# Patient Record
Sex: Female | Born: 1995 | Race: White | Hispanic: No | Marital: Married | Smoking: Never smoker
Health system: Southern US, Community
[De-identification: ages and names within clinical notes are randomized; demographics above are authoritative.]

## PROBLEM LIST (undated history)

## (undated) DIAGNOSIS — T4145XA Adverse effect of unspecified anesthetic, initial encounter: Secondary | ICD-10-CM

## (undated) DIAGNOSIS — Z803 Family history of malignant neoplasm of breast: Secondary | ICD-10-CM

## (undated) DIAGNOSIS — R519 Headache, unspecified: Secondary | ICD-10-CM

## (undated) DIAGNOSIS — R51 Headache: Secondary | ICD-10-CM

## (undated) DIAGNOSIS — T79A21A Traumatic compartment syndrome of right lower extremity, initial encounter: Secondary | ICD-10-CM

## (undated) DIAGNOSIS — S060X9A Concussion with loss of consciousness of unspecified duration, initial encounter: Secondary | ICD-10-CM

## (undated) DIAGNOSIS — S060XAA Concussion with loss of consciousness status unknown, initial encounter: Secondary | ICD-10-CM

## (undated) DIAGNOSIS — R112 Nausea with vomiting, unspecified: Secondary | ICD-10-CM

## (undated) DIAGNOSIS — Z9889 Other specified postprocedural states: Secondary | ICD-10-CM

## (undated) DIAGNOSIS — T8859XA Other complications of anesthesia, initial encounter: Secondary | ICD-10-CM

## (undated) DIAGNOSIS — J45909 Unspecified asthma, uncomplicated: Secondary | ICD-10-CM

## (undated) HISTORY — DX: Family history of malignant neoplasm of breast: Z80.3

## (undated) HISTORY — PX: WISDOM TOOTH EXTRACTION: SHX21

---
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
## 2017-02-17 ENCOUNTER — Telehealth: Payer: Self-pay | Admitting: Sports Medicine

## 2017-02-17 NOTE — Telephone Encounter (Signed)
  [employer] Training Room Note Teresa Garrett. Teresa Garrett, [name]  Teresa Garrett - 21 y.o. female MRN [id number]  Date of birth: May 31, 1996  Visit Date: 02/17/17  PCP: Teresa Garrett, Teresa Garrett   Referred by: Teresa ref. provider found  SUBJECTIVE:  CC: Right medial shin pain HPI: Recurrence of right medial shin pain that she had similarly last year.  Prior MRI last year was negative for stress fracture.  She is significantly increased her mileage over the summer and has had a recurrence of pain.  She is taking last 2 weeks off and her symptoms are significantly approved.   she has tried running again with Teresa significant complaints. ROS:  Otherwise Garrett HPI.  Physical exam: Right leg is overall well aligned.  She has Teresa significant bruising swelling.  Teresa focal bony tenderness.  Posterior tibialis tendon is mildly tender to palpation.  Moderately high cavus arch.  Next  Assessment medial tibial stress syndrome Plan: We had previously fabricated custom orthotics for but these she reports as being uncomfortable.  I would like for the athletic training staff to look into this and see if we can get her into something that does provide support.  I am okay with beginning to resume the return to running protocol and will plan to follow-up with her only on an as-needed basis if any issues.  We discussed the importance of slowly increasing in her activity level.      Teresa Garrett Sports Medicine Physician    02/17/2017 6:26 PM

## 2017-07-23 DIAGNOSIS — N921 Excessive and frequent menstruation with irregular cycle: Secondary | ICD-10-CM | POA: Diagnosis not present

## 2017-08-22 DIAGNOSIS — M9902 Segmental and somatic dysfunction of thoracic region: Secondary | ICD-10-CM | POA: Diagnosis not present

## 2017-08-22 DIAGNOSIS — M62830 Muscle spasm of back: Secondary | ICD-10-CM | POA: Diagnosis not present

## 2017-08-22 DIAGNOSIS — M9905 Segmental and somatic dysfunction of pelvic region: Secondary | ICD-10-CM | POA: Diagnosis not present

## 2017-08-22 DIAGNOSIS — M9903 Segmental and somatic dysfunction of lumbar region: Secondary | ICD-10-CM | POA: Diagnosis not present

## 2017-08-26 DIAGNOSIS — M9902 Segmental and somatic dysfunction of thoracic region: Secondary | ICD-10-CM | POA: Diagnosis not present

## 2017-08-26 DIAGNOSIS — M62830 Muscle spasm of back: Secondary | ICD-10-CM | POA: Diagnosis not present

## 2017-08-26 DIAGNOSIS — M9903 Segmental and somatic dysfunction of lumbar region: Secondary | ICD-10-CM | POA: Diagnosis not present

## 2017-08-26 DIAGNOSIS — M9905 Segmental and somatic dysfunction of pelvic region: Secondary | ICD-10-CM | POA: Diagnosis not present

## 2017-09-29 DIAGNOSIS — M9905 Segmental and somatic dysfunction of pelvic region: Secondary | ICD-10-CM | POA: Diagnosis not present

## 2017-09-29 DIAGNOSIS — M9903 Segmental and somatic dysfunction of lumbar region: Secondary | ICD-10-CM | POA: Diagnosis not present

## 2017-09-29 DIAGNOSIS — M62830 Muscle spasm of back: Secondary | ICD-10-CM | POA: Diagnosis not present

## 2017-09-29 DIAGNOSIS — M9902 Segmental and somatic dysfunction of thoracic region: Secondary | ICD-10-CM | POA: Diagnosis not present

## 2018-04-28 ENCOUNTER — Ambulatory Visit (INDEPENDENT_AMBULATORY_CARE_PROVIDER_SITE_OTHER): Payer: BLUE CROSS/BLUE SHIELD | Admitting: Sports Medicine

## 2018-04-28 VITALS — BP 108/62 | Ht 67.0 in | Wt 130.0 lb

## 2018-04-28 DIAGNOSIS — M79661 Pain in right lower leg: Secondary | ICD-10-CM | POA: Diagnosis not present

## 2018-04-28 NOTE — Progress Notes (Signed)
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  She states that 3 weeks ago she was running her usual practice run, when she felt some cramping pain in her right medial calf.  She states this is about 30 minutes into her run.  She states she also has some associated numbness tingling of her middle 3 toes.  She stopped running and applied ice, massage, cupping over the area.  She had little relief from this.  She also took Advil at that time.  She states since that time she has had calf pain every time she does any kind of a long walk, or run.  She states she usually noticed about 10 minutes into the walk or run.  She states she has associated numbness in her 3 middle toes every time.  She states the pain is usually in the medial and lateral compartments of her lower calf.  She states after she stops running, she gets some relief.  She did see an orthopedic office 2 weeks ago.  The told her they believe this exertional compartment syndrome and put her in a calf compression sleeve.  She states this did not help with her symptoms.  She denies any weakness in the leg.  She denies any balance issues.  She denies any bowel or bladder dysfunction.  She has no prior injury to this area.  She does have lower back pain from time to time, which she goes to a chiropractor for.  Past Injuries: None Past Surgeries: None Smoking: Denies Family Hx: Noncontributory  ROS: Per HPI; in addition no fever, no rash, no additional weakness, no additional numbness, no additional paresthesias, and no additional falls/injury.  All past medical history, allergies, medications reviewed myself at today's visit.  Objective: BP 108/62   Ht 5\' 7"  (1.702 m)   Wt 130 lb (59 kg)   BMI 20.36 kg/m  Gen: Right-Hand Dominant. NAD, well groomed, a/o x3, normal affect.  CV: Well-perfused. Warm.  Resp: Non-labored.  Neuro: Sensation intact throughout. No gross  coordination deficits.  Gait: Nonpathologic posture, unremarkable stride without signs of limp or balance issues.  Back exam: No erythema, warmth, swelling noted.  No tenderness palpation on back exam.  Full range of motion forward flexion, extension of back.  Strength 5 out of 5 throughout lower extremity testing.  Negative straight leg raise, negative FADIR and FABER testing.  Calf exam: No erythema, warmth, swelling noted.  Mild tenderness palpation along the medial calf, mild tenderness palpation over the lateral calf.  Full range of motion in dorsiflexion, plantarflexion, eversion, inversion of the foot.  Strength 5 out of 5 throughout testing.  Negative Homans sign.  Brief neuro exam: Patellar and Achilles reflex 2+.  Assessment and Plan: Cramping calf pain with exertion, with some numbness of toes.  We discussed treatment options at today's visit.  She would like to pursue all options prior to undergoing compartment testing of her legs.  We will get an MRI of the lumbar spine as well as of the tib-fib at today's visit.  Lumbar MRI will rule out disc involvement leading to the cramping pain in her calf.  The tib-fib MRI will rule out stress fracture as a contributing cause.  If both these are normal, she should proceed with the Stryker testing for exertional compartment syndrome.  We will see her back following these test.  Lewanda Rife, MD [place] Fellow 04/28/2018  11:43 AM   Patient seen and evaluated with the sports medicine fellow.  I agree with the above plan of care.  Although patient's symptoms may suggest exercise-induced compartment syndrome, I think it is reasonable to rule out other sources of pain.  Therefore, we will order an MRI of the right tib-fib to rule out possible stress fracture and an MRI of her lumbar spine to rule out lumbar radiculopathy.  Phone follow-up with those findings when available.  If both studies are unremarkable then I would recommend  that she proceed with compartment testing.

## 2018-04-29 ENCOUNTER — Encounter: Payer: Self-pay | Admitting: Sports Medicine

## 2018-05-03 ENCOUNTER — Ambulatory Visit
Admission: RE | Admit: 2018-05-03 | Discharge: 2018-05-03 | Disposition: A | Discharge status: Home / Self Care | Payer: PRIVATE HEALTH INSURANCE | Source: Ambulatory Visit | Attending: Sports Medicine | Admitting: Sports Medicine

## 2018-05-03 DIAGNOSIS — M5126 Other intervertebral disc displacement, lumbar region: Secondary | ICD-10-CM | POA: Diagnosis not present

## 2018-05-03 DIAGNOSIS — M79661 Pain in right lower leg: Secondary | ICD-10-CM

## 2018-05-03 DIAGNOSIS — R600 Localized edema: Secondary | ICD-10-CM | POA: Diagnosis not present

## 2018-05-11 ENCOUNTER — Encounter: Payer: Self-pay | Admitting: Sports Medicine

## 2018-05-11 ENCOUNTER — Ambulatory Visit (INDEPENDENT_AMBULATORY_CARE_PROVIDER_SITE_OTHER): Payer: BLUE CROSS/BLUE SHIELD | Admitting: Sports Medicine

## 2018-05-11 VITALS — BP 103/62 | Ht 67.0 in | Wt 130.0 lb

## 2018-05-11 DIAGNOSIS — M79661 Pain in right lower leg: Secondary | ICD-10-CM

## 2018-05-11 NOTE — Progress Notes (Signed)
Patient ID: Teresa Garrett, female   DOB: [birth year], 22 y.o.   MRN: [id number]  Patient comes in today with her athletic trainer to discuss MRI findings of her lumbar spine as well as of her right tib-fib.  MRI of the lumbar spine shows some minimal disc bulging but no disc herniation.  MRI of the right lower leg show some periostitis along the proximal to mid tibia shaft posteriorly in the vicinity of the tibialis posterior. No stress fracture. Her physical exam today shows tenderness of both medial and lateral aspects of her lower leg.  Repeated toe raises reproduces lateral pain but she has no pain medially.  Although she has MRI changes in the right lower leg, I think these are likely incidental secondary to her running.  Her symptoms strongly suggest compartment syndrome.  I will refer her back over to Dr. Sharol Given to for consideration of compartment testing.  Patient and the athletic trainer are in agreement with this plan.

## 2018-06-29 ENCOUNTER — Ambulatory Visit (INDEPENDENT_AMBULATORY_CARE_PROVIDER_SITE_OTHER): Payer: BLUE CROSS/BLUE SHIELD | Admitting: Orthopedic Surgery

## 2018-06-29 ENCOUNTER — Encounter (INDEPENDENT_AMBULATORY_CARE_PROVIDER_SITE_OTHER): Payer: Self-pay | Admitting: Orthopedic Surgery

## 2018-06-29 VITALS — Ht 67.0 in | Wt 130.0 lb

## 2018-06-29 DIAGNOSIS — M79A21 Nontraumatic compartment syndrome of right lower extremity: Secondary | ICD-10-CM | POA: Diagnosis not present

## 2018-06-29 NOTE — Progress Notes (Signed)
   Office Visit Note   Patient: Teresa Garrett           Date of Birth: [birth year]           MRN: [id number] Visit Date: 06/29/2018              Requested by: No referring provider defined for this encounter. PCP: Patient, No Pcp Per  Chief Complaint  Patient presents with  . 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  Patient has been having exercised induced compartment symptoms in her anterior compartment of the right calf.  Patient states this is been going on for several months.  Patient has no pain at rest only has pain after running for a certain period of time.  Assessment & Plan: Visit Diagnoses:  1. 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  Patient states she would like to proceed with an anterior and lateral compartment release.  Risk and benefits were discussed including numbness on the dorsum of her foot.  Patient states she understands and wished to proceed as soon as possible.  Follow-Up Instructions: Return in about 2 weeks (around 07/13/2018).   Ortho Exam  Patient is alert, oriented, no adenopathy, well-dressed, normal affect, normal respiratory effort. Examination patient's right lower extremity is neurovascular intact she has good pulses she states occasionally after running she has some numbness over the dorsum of her foot she has no numbness at this time.  Her anterior compartment is soft there is no tenderness to palpation over the superficial peroneal nerve.  After informed consent the Stryker pressure monitor was used and her anterior compartment was measured at 20 mmHg.  Patient then went outside and ran became symptomatic and the compartment pressure was repeated at this time the compartment pressure was 58.  Patient has symptoms and signs consistent with exercised induced compartment  syndrome.  Imaging: No results found. No images are attached to the encounter.  Labs: No results found for: HGBA1C, ESRSEDRATE, CRP, LABURIC, REPTSTATUS, GRAMSTAIN, CULT, LABORGA   Lab Results  Component Value Date   ALBUMIN 4.1 05/06/2016    Body mass index is 20.36 kg/m.  Orders:  No orders of the defined types were placed in this encounter.  No orders of the defined types were placed in this encounter.    Procedures: No procedures performed  Clinical Data: No additional findings.  ROS:  All other systems negative, except as noted in the HPI. Review of Systems  Objective: Vital Signs: Ht 5\' 7"  (1.702 m)   Wt 130 lb (59 kg)   BMI 20.36 kg/m   Specialty Comments:  No specialty comments available.  PMFS History: Patient Active Problem List   Diagnosis Date Noted  . Medial tibial stress syndrome, left, initial encounter 04/30/2016   History reviewed. No pertinent past medical history.  History reviewed. No pertinent family history.  History reviewed. No pertinent surgical history. Social History   Occupational History  . Not on file  Tobacco Use  . Smoking status: Never Smoker  . Smokeless tobacco: Never Used  Substance and Sexual Activity  . Alcohol use: No  . Drug use: No  . Sexual activity: Yes    Birth control/protection: None

## 2018-07-01 ENCOUNTER — Other Ambulatory Visit: Payer: Self-pay

## 2018-07-01 ENCOUNTER — Encounter (HOSPITAL_COMMUNITY): Payer: Self-pay | Admitting: *Deleted

## 2018-07-01 NOTE — Progress Notes (Signed)
Ms Teresa Garrett denies chest pain, shortness of breath.  Patient takes an over the counter  dietary supplement, I instructed her to stop it at this time.

## 2018-07-02 ENCOUNTER — Ambulatory Visit (INDEPENDENT_AMBULATORY_CARE_PROVIDER_SITE_OTHER): Payer: Self-pay | Admitting: Physician Assistant

## 2018-07-03 ENCOUNTER — Encounter (HOSPITAL_COMMUNITY): Payer: Self-pay

## 2018-07-03 ENCOUNTER — Ambulatory Visit (HOSPITAL_COMMUNITY): Payer: BLUE CROSS/BLUE SHIELD | Admitting: Anesthesiology

## 2018-07-03 ENCOUNTER — Other Ambulatory Visit: Payer: Self-pay

## 2018-07-03 ENCOUNTER — Ambulatory Visit (HOSPITAL_COMMUNITY)
Admission: RE | Admit: 2018-07-03 | Discharge: 2018-07-03 | Disposition: A | Discharge status: Home / Self Care | Payer: BLUE CROSS/BLUE SHIELD | Attending: Orthopedic Surgery | Admitting: Orthopedic Surgery

## 2018-07-03 ENCOUNTER — Encounter (HOSPITAL_COMMUNITY)
Admission: RE | Disposition: A | Discharge status: Home / Self Care | Payer: Self-pay | Source: Home / Self Care | Attending: Orthopedic Surgery

## 2018-07-03 DIAGNOSIS — X58XXXA Exposure to other specified factors, initial encounter: Secondary | ICD-10-CM | POA: Diagnosis not present

## 2018-07-03 DIAGNOSIS — M79A21 Nontraumatic compartment syndrome of right lower extremity: Secondary | ICD-10-CM | POA: Diagnosis not present

## 2018-07-03 DIAGNOSIS — T79A21A Traumatic compartment syndrome of right lower extremity, initial encounter: Secondary | ICD-10-CM | POA: Diagnosis not present

## 2018-07-03 DIAGNOSIS — Y9302 Activity, running: Secondary | ICD-10-CM | POA: Insufficient documentation

## 2018-07-03 HISTORY — PX: FASCIOTOMY: SHX132

## 2018-07-03 HISTORY — DX: Traumatic compartment syndrome of right lower extremity, initial encounter: T79.A21A

## 2018-07-03 HISTORY — DX: Adverse effect of unspecified anesthetic, initial encounter: T41.45XA

## 2018-07-03 HISTORY — DX: Unspecified asthma, uncomplicated: J45.909

## 2018-07-03 HISTORY — DX: Concussion with loss of consciousness of unspecified duration, initial encounter: S06.0X9A

## 2018-07-03 HISTORY — DX: Headache: R51

## 2018-07-03 HISTORY — DX: Other specified postprocedural states: R11.2

## 2018-07-03 HISTORY — DX: Nausea with vomiting, unspecified: R11.2

## 2018-07-03 HISTORY — DX: Concussion with loss of consciousness status unknown, initial encounter: S06.0XAA

## 2018-07-03 HISTORY — DX: Other complications of anesthesia, initial encounter: T88.59XA

## 2018-07-03 HISTORY — DX: Headache, unspecified: R51.9

## 2018-07-03 HISTORY — DX: Other specified postprocedural states: Z98.890

## 2018-07-03 LAB — POCT PREGNANCY, URINE: Preg Test, Ur: NEGATIVE

## 2018-07-03 LAB — HEMOGLOBIN: Hemoglobin: 13.9 g/dL (ref 12.0–15.0)

## 2018-07-03 SURGERY — FASCIOTOMY, UPPER EXTREMITY
Anesthesia: General | Site: Leg Lower | Laterality: Right

## 2018-07-03 MED ORDER — ACETAMINOPHEN 500 MG PO TABS
1000.0000 mg | ORAL_TABLET | Freq: Once | ORAL | Status: AC
  Administered 2018-07-03: 1000 mg via ORAL

## 2018-07-03 MED ORDER — FENTANYL CITRATE (PF) 250 MCG/5ML IJ SOLN
INTRAMUSCULAR | Status: DC | PRN
  Administered 2018-07-03 (×2): 25 ug via INTRAVENOUS

## 2018-07-03 MED ORDER — MIDAZOLAM HCL 2 MG/2ML IJ SOLN
INTRAMUSCULAR | Status: AC
  Filled 2018-07-03: qty 2

## 2018-07-03 MED ORDER — CELECOXIB 200 MG PO CAPS
ORAL_CAPSULE | ORAL | Status: AC
  Administered 2018-07-03: 200 mg via ORAL
  Filled 2018-07-03: qty 1

## 2018-07-03 MED ORDER — CEFAZOLIN SODIUM-DEXTROSE 2-4 GM/100ML-% IV SOLN
2.0000 g | INTRAVENOUS | Status: AC
  Administered 2018-07-03: 2 g via INTRAVENOUS

## 2018-07-03 MED ORDER — ONDANSETRON HCL 4 MG/2ML IJ SOLN
INTRAMUSCULAR | Status: DC | PRN
  Administered 2018-07-03: 4 mg via INTRAVENOUS

## 2018-07-03 MED ORDER — MIDAZOLAM HCL 5 MG/5ML IJ SOLN
INTRAMUSCULAR | Status: DC | PRN
  Administered 2018-07-03: 2 mg via INTRAVENOUS

## 2018-07-03 MED ORDER — FAMOTIDINE 20 MG PO TABS
20.0000 mg | ORAL_TABLET | Freq: Once | ORAL | Status: AC
  Administered 2018-07-03: 20 mg via ORAL

## 2018-07-03 MED ORDER — HYDROMORPHONE HCL 1 MG/ML IJ SOLN
INTRAMUSCULAR | Status: AC
  Filled 2018-07-03: qty 1

## 2018-07-03 MED ORDER — DEXAMETHASONE SODIUM PHOSPHATE 10 MG/ML IJ SOLN
INTRAMUSCULAR | Status: DC | PRN
  Administered 2018-07-03: 10 mg via INTRAVENOUS

## 2018-07-03 MED ORDER — HYDROCODONE-ACETAMINOPHEN 5-325 MG PO TABS: 1.0000 | ORAL_TABLET | ORAL | 0 refills | Status: DC | PRN

## 2018-07-03 MED ORDER — LIDOCAINE 2% (20 MG/ML) 5 ML SYRINGE
INTRAMUSCULAR | Status: DC | PRN
  Administered 2018-07-03: 80 mg via INTRAVENOUS

## 2018-07-03 MED ORDER — LACTATED RINGERS IV SOLN
INTRAVENOUS | Status: DC
  Administered 2018-07-03: 11:00:00 via INTRAVENOUS

## 2018-07-03 MED ORDER — CEFAZOLIN SODIUM-DEXTROSE 2-4 GM/100ML-% IV SOLN
INTRAVENOUS | Status: AC
  Filled 2018-07-03: qty 100

## 2018-07-03 MED ORDER — OXYCODONE HCL 5 MG/5ML PO SOLN: 5.0000 mg | Freq: Once | ORAL | Status: DC | PRN

## 2018-07-03 MED ORDER — FAMOTIDINE 20 MG PO TABS
ORAL_TABLET | ORAL | Status: AC
  Administered 2018-07-03: 20 mg via ORAL
  Filled 2018-07-03: qty 1

## 2018-07-03 MED ORDER — DEXAMETHASONE SODIUM PHOSPHATE 10 MG/ML IJ SOLN
INTRAMUSCULAR | Status: AC
  Filled 2018-07-03: qty 1

## 2018-07-03 MED ORDER — HYDROCODONE-ACETAMINOPHEN 5-325 MG PO TABS
ORAL_TABLET | ORAL | Status: AC
  Filled 2018-07-03: qty 1

## 2018-07-03 MED ORDER — PROPOFOL 10 MG/ML IV BOLUS
INTRAVENOUS | Status: DC | PRN
  Administered 2018-07-03: 150 mg via INTRAVENOUS
  Administered 2018-07-03: 50 mg via INTRAVENOUS

## 2018-07-03 MED ORDER — HYDROCODONE-ACETAMINOPHEN 5-325 MG PO TABS
1.0000 | ORAL_TABLET | Freq: Once | ORAL | Status: AC
  Administered 2018-07-03: 1 via ORAL

## 2018-07-03 MED ORDER — CELECOXIB 200 MG PO CAPS
200.0000 mg | ORAL_CAPSULE | Freq: Once | ORAL | Status: AC
  Administered 2018-07-03: 200 mg via ORAL

## 2018-07-03 MED ORDER — PROPOFOL 10 MG/ML IV BOLUS
INTRAVENOUS | Status: AC
  Filled 2018-07-03: qty 20

## 2018-07-03 MED ORDER — CHLORHEXIDINE GLUCONATE 4 % EX LIQD: 60.0000 mL | Freq: Once | CUTANEOUS | Status: DC

## 2018-07-03 MED ORDER — HYDROMORPHONE HCL 1 MG/ML IJ SOLN
0.2500 mg | INTRAMUSCULAR | Status: DC | PRN
  Administered 2018-07-03: 0.25 mg via INTRAVENOUS

## 2018-07-03 MED ORDER — OXYCODONE HCL 5 MG PO TABS: 5.0000 mg | ORAL_TABLET | Freq: Once | ORAL | Status: DC | PRN

## 2018-07-03 MED ORDER — LACTATED RINGERS IV SOLN
INTRAVENOUS | Status: DC | PRN
  Administered 2018-07-03: 10:00:00 via INTRAVENOUS

## 2018-07-03 MED ORDER — PROMETHAZINE HCL 25 MG/ML IJ SOLN: 6.2500 mg | INTRAMUSCULAR | Status: DC | PRN

## 2018-07-03 MED ORDER — ONDANSETRON HCL 4 MG/2ML IJ SOLN
INTRAMUSCULAR | Status: AC
  Filled 2018-07-03: qty 2

## 2018-07-03 MED ORDER — FENTANYL CITRATE (PF) 250 MCG/5ML IJ SOLN
INTRAMUSCULAR | Status: AC
  Filled 2018-07-03: qty 5

## 2018-07-03 MED ORDER — ACETAMINOPHEN 500 MG PO TABS
ORAL_TABLET | ORAL | Status: AC
  Administered 2018-07-03: 1000 mg via ORAL
  Filled 2018-07-03: qty 2

## 2018-07-03 MED ORDER — 0.9 % SODIUM CHLORIDE (POUR BTL) OPTIME
TOPICAL | Status: DC | PRN
  Administered 2018-07-03: 1000 mL

## 2018-07-03 SURGICAL SUPPLY — 55 items
BANDAGE ACE 4X5 VEL STRL LF (GAUZE/BANDAGES/DRESSINGS) ×1 IMPLANT
BANDAGE ACE 6X5 VEL STRL LF (GAUZE/BANDAGES/DRESSINGS) ×1 IMPLANT
BNDG COHESIVE 4X5 TAN STRL (GAUZE/BANDAGES/DRESSINGS) ×2 IMPLANT
BNDG GAUZE ELAST 4 BULKY (GAUZE/BANDAGES/DRESSINGS) ×2 IMPLANT
CANISTER WOUND CARE 500ML ATS (WOUND CARE) IMPLANT
COVER SURGICAL LIGHT HANDLE (MISCELLANEOUS) ×2 IMPLANT
COVER WAND RF STERILE (DRAPES) ×1 IMPLANT
CUFF TOURNIQUET SINGLE 24IN (TOURNIQUET CUFF) IMPLANT
CUFF TOURNIQUET SINGLE 34IN LL (TOURNIQUET CUFF) IMPLANT
DRAPE INCISE IOBAN 66X45 STRL (DRAPES) ×1 IMPLANT
DRAPE ORTHO SPLIT 77X108 STRL (DRAPES)
DRAPE SURG ORHT 6 SPLT 77X108 (DRAPES) ×2 IMPLANT
DRAPE U-SHAPE 47X51 STRL (DRAPES) ×2 IMPLANT
DRSG ADAPTIC 3X8 NADH LF (GAUZE/BANDAGES/DRESSINGS) ×2 IMPLANT
DRSG PAD ABDOMINAL 8X10 ST (GAUZE/BANDAGES/DRESSINGS) ×2 IMPLANT
DRSG VAC ATS LRG SENSATRAC (GAUZE/BANDAGES/DRESSINGS) IMPLANT
DRSG VAC ATS MED SENSATRAC (GAUZE/BANDAGES/DRESSINGS) IMPLANT
DRSG VAC ATS SM SENSATRAC (GAUZE/BANDAGES/DRESSINGS) IMPLANT
DURAPREP 26ML APPLICATOR (WOUND CARE) ×2 IMPLANT
ELECT REM PT RETURN 9FT ADLT (ELECTROSURGICAL) ×2
ELECTRODE REM PT RTRN 9FT ADLT (ELECTROSURGICAL) ×1 IMPLANT
GAUZE SPONGE 4X4 12PLY STRL (GAUZE/BANDAGES/DRESSINGS) ×2 IMPLANT
GAUZE SPONGE 4X4 12PLY STRL LF (GAUZE/BANDAGES/DRESSINGS) ×1 IMPLANT
GLOVE BIOGEL PI IND STRL 7.5 (GLOVE) IMPLANT
GLOVE BIOGEL PI IND STRL 9 (GLOVE) ×1 IMPLANT
GLOVE BIOGEL PI INDICATOR 7.5 (GLOVE) ×1
GLOVE BIOGEL PI INDICATOR 9 (GLOVE) ×1
GLOVE SURG ORTHO 9.0 STRL STRW (GLOVE) ×2 IMPLANT
GLOVE SURG SS PI 6.5 STRL IVOR (GLOVE) ×1 IMPLANT
GLOVE SURG SS PI 7.0 STRL IVOR (GLOVE) ×2 IMPLANT
GLOVE SURG SS PI 7.5 STRL IVOR (GLOVE) ×2 IMPLANT
GOWN STRL REUS W/ TWL LRG LVL4 (GOWN DISPOSABLE) IMPLANT
GOWN STRL REUS W/ TWL XL LVL3 (GOWN DISPOSABLE) ×3 IMPLANT
GOWN STRL REUS W/TWL LRG LVL4 (GOWN DISPOSABLE) ×3
GOWN STRL REUS W/TWL XL LVL3 (GOWN DISPOSABLE) ×1
KIT BASIN OR (CUSTOM PROCEDURE TRAY) ×2 IMPLANT
KIT TURNOVER KIT B (KITS) ×2 IMPLANT
MANIFOLD NEPTUNE II (INSTRUMENTS) ×1 IMPLANT
MARKER SKIN DUAL TIP RULER LAB (MISCELLANEOUS) ×1 IMPLANT
NS IRRIG 1000ML POUR BTL (IV SOLUTION) ×2 IMPLANT
PACK GENERAL/GYN (CUSTOM PROCEDURE TRAY) ×2 IMPLANT
PAD ARMBOARD 7.5X6 YLW CONV (MISCELLANEOUS) ×5 IMPLANT
SET MONITOR QUICK PRESSURE (MISCELLANEOUS) IMPLANT
SPONGE LAP 18X18 X RAY DECT (DISPOSABLE) ×1 IMPLANT
STAPLER VISISTAT 35W (STAPLE) IMPLANT
STOCKINETTE IMPERVIOUS 9X36 MD (GAUZE/BANDAGES/DRESSINGS) ×1 IMPLANT
STRIP CLOSURE SKIN 1/2X4 (GAUZE/BANDAGES/DRESSINGS) ×1 IMPLANT
SUT ETHILON 2 0 FSLX (SUTURE) ×1 IMPLANT
SUT MNCRL AB 3-0 PS2 18 (SUTURE) ×1 IMPLANT
SUT VIC AB 0 CTB1 27 (SUTURE) IMPLANT
SUT VIC AB 2-0 CT2 27 (SUTURE) ×1 IMPLANT
SUT VIC AB 2-0 CTB1 (SUTURE) IMPLANT
TOWEL OR 17X24 6PK STRL BLUE (TOWEL DISPOSABLE) ×1 IMPLANT
TOWEL OR 17X26 10 PK STRL BLUE (TOWEL DISPOSABLE) ×2 IMPLANT
WATER STERILE IRR 1000ML POUR (IV SOLUTION) ×1 IMPLANT

## 2018-07-03 NOTE — Anesthesia Postprocedure Evaluation (Signed)
Anesthesia Post Note  Patient: Teresa Garrett  Procedure(s) Performed: ANTERIOR AND LATERAL COMPARTMENT RELEASE RIGHT LEG (Right Leg Lower)     Patient location during evaluation: PACU Anesthesia Type: General Level of consciousness: awake and alert Pain management: pain level controlled Vital Signs Assessment: post-procedure vital signs reviewed and stable Respiratory status: spontaneous breathing, nonlabored ventilation, respiratory function stable and patient connected to nasal cannula oxygen Cardiovascular status: blood pressure returned to baseline and stable Postop Assessment: no apparent nausea or vomiting Anesthetic complications: no    Last Vitals:  Vitals:   07/03/18 1415 07/03/18 1425  BP:  116/65  Pulse: (!) 56 65  Resp: 16 16  Temp:    SpO2: 100% 100%    Last Pain:  Vitals:   07/03/18 1425  TempSrc:   PainSc: 4                  Ryan P Ellender

## 2018-07-03 NOTE — Op Note (Signed)
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 female with a diagnosis of Exercise Induced Compatment Syndrome who failed conservative measures and elected for surgical management.    The risks benefits and alternatives were discussed with the patient preoperatively including but not limited to the risks of infection, bleeding, nerve injury, cardiopulmonary complications, the need for revision surgery, among others, and the patient was willing to proceed.  OPERATIVE IMPLANTS: None.  @ENCIMAGES @  OPERATIVE FINDINGS: Superficial peroneal nerve protected and intact and released.  OPERATIVE PROCEDURE: Patient was brought to the operating room and underwent a general anesthetic.  After adequate levels anesthesia were obtained patient's right lower extremity was prepped using DuraPrep draped into a sterile field a timeout was called.  A lateral incision was made 10 to 15 cm proximal to the lateral malleolus.  Blunt dissection was carried down to the superficial peroneal nerve the exiting foramen was released.  The anterior compartment was released proximal and distal with the nerve protected.  The lateral compartment was then released with the nerve protected.  Wound was irrigated with normal saline there was no bleeding.  Subcu was closed using 2-0 Vicryl subcu was closed using 3-0 Monocryl.  A sterile compressive dressing was applied patient was extubated taken the PACU in stable condition.   DISCHARGE PLANNING:  Antibiotic duration: Preoperative antibiotics only  Weightbearing: Weightbearing as tolerated  Pain medication: Prescription for Vicodin  Dressing care/ Wound VAC: Leave  dressing in place for 3 days then use the compression socks  Ambulatory devices: Crutches  Discharge to: Home  Follow-up: In the office 1 week post operative.

## 2018-07-03 NOTE — Progress Notes (Signed)
Orthopedic Tech Progress Note Patient Details:  Teresa Garrett [birth year] [id number]  Ortho Devices Type of Ortho Device: Crutches Ortho Device/Splint Interventions: Ordered, Application, Adjustment   Post Interventions Patient Tolerated: Well Instructions Provided: Adjustment of device, Care of device   Teresa Garrett J Raynee Mccasland 07/03/2018, 3:42 PM

## 2018-07-03 NOTE — H&P (Signed)
Franceska Strahm is an 23 y.o. female.   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  Patient has been having exercised induced compartment symptoms in her anterior compartment of the right calf.  Patient states this is been going on for several months.  Patient has no pain at rest only has pain after running for a certain period of time.The patient's compartment pressures increased from 20 mm Hg to 58 mm Hg after exercise. She presents for anterior and lateral compartment releases.   Past Medical History:  Diagnosis Date  . Asthma    as a child  . Concussion   . Headache    migraines-     Past Surgical History:  Procedure Laterality Date  . WISDOM TOOTH EXTRACTION     "gas"    History reviewed. No pertinent family history. Social History:  reports that she has never smoked. She has never used smokeless tobacco. She reports that she does not drink alcohol or use drugs.  Allergies:  Allergies  Allergen Reactions  . Apple Itching and Nausea Only    No medications prior to admission.    No results found for this or any previous visit (from the past 48 hour(s)). No results found.  Review of Systems  All other systems reviewed and are negative.   Last menstrual period 06/20/2018. Physical Exam  Constitutional: She appears well-developed and well-nourished. No distress.  HENT:  Head: Normocephalic and atraumatic.  Neck: Normal range of motion. Neck supple. No tracheal deviation present. No thyromegaly present.  Cardiovascular: Normal rate, regular rhythm, normal heart sounds and intact distal pulses.  Respiratory: Effort normal. No respiratory distress.  GI: Soft. She exhibits no distension.  Musculoskeletal: Normal range of motion.        General: Tenderness (right anterior/lateral compartments following exercise) present.  Neurological: She is alert. No cranial nerve deficit. She exhibits  normal muscle tone. Coordination normal.  Skin: Skin is warm and dry. No erythema.  Psychiatric: She has a normal mood and affect. Her behavior is normal. Thought content normal.     Assessment/Plan Exercised induced compartment syndrome right lower extremity- Plan anterior and lateral compartment releases.  This procedure has been fully reviewed with the patient and written informed consent has been obtained.   Erlinda Hong, PA-C 07/03/2018, [place] [phone]

## 2018-07-03 NOTE — Discharge Instructions (Signed)
General Anesthesia, Adult, Care After  This sheet gives you information about how to care for yourself after your procedure. Your health care provider may also give you more specific instructions. If you have problems or questions, contact your health care provider.  What can I expect after the procedure?  After the procedure, the following side effects are common:  Pain or discomfort at the IV site.  Nausea.  Vomiting.  Sore throat.  Trouble concentrating.  Feeling cold or chills.  Weak or tired.  Sleepiness and fatigue.  Soreness and body aches. These side effects can affect parts of the body that were not involved in surgery.  Follow these instructions at home:    For at least 24 hours after the procedure:  Have a responsible adult stay with you. It is important to have someone help care for you until you are awake and alert.  Rest as needed.  Do not:  Participate in activities in which you could fall or become injured.  Drive.  Use heavy machinery.  Drink alcohol.  Take sleeping pills or medicines that cause drowsiness.  Make important decisions or sign legal documents.  Take care of children on your own.  Eating and drinking  Follow any instructions from your health care provider about eating or drinking restrictions.  When you feel hungry, start by eating small amounts of foods that are soft and easy to digest (bland), such as toast. Gradually return to your regular diet.  Drink enough fluid to keep your urine pale yellow.  If you vomit, rehydrate by drinking water, juice, or clear broth.  General instructions  If you have sleep apnea, surgery and certain medicines can increase your risk for breathing problems. Follow instructions from your health care provider about wearing your sleep device:  Anytime you are sleeping, including during daytime naps.  While taking prescription pain medicines, sleeping medicines, or medicines that make you drowsy.  Return to your normal activities as told by your health care  provider. Ask your health care provider what activities are safe for you.  Take over-the-counter and prescription medicines only as told by your health care provider.  If you smoke, do not smoke without supervision.  Keep all follow-up visits as told by your health care provider. This is important.  Contact a health care provider if:  You have nausea or vomiting that does not get better with medicine.  You cannot eat or drink without vomiting.  You have pain that does not get better with medicine.  You are unable to pass urine.  You develop a skin rash.  You have a fever.  You have redness around your IV site that gets worse.  Get help right away if:  You have difficulty breathing.  You have chest pain.  You have blood in your urine or stool, or you vomit blood.  Summary  After the procedure, it is common to have a sore throat or nausea. It is also common to feel tired.  Have a responsible adult stay with you for the first 24 hours after general anesthesia. It is important to have someone help care for you until you are awake and alert.  When you feel hungry, start by eating small amounts of foods that are soft and easy to digest (bland), such as toast. Gradually return to your regular diet.  Drink enough fluid to keep your urine pale yellow.  Return to your normal activities as told by your health care provider. Ask your health care   provider what activities are safe for you.  This information is not intended to replace advice given to you by your health care provider. Make sure you discuss any questions you have with your health care provider.  Document Released: 09/16/2000 Document Revised: 01/24/2017 Document Reviewed: 01/24/2017  Elsevier Interactive Patient Education  2019 [employer]

## 2018-07-03 NOTE — Transfer of Care (Signed)
Immediate Anesthesia Transfer of Care Note  Patient: Teresa Garrett  Procedure(s) Performed: ANTERIOR AND LATERAL COMPARTMENT RELEASE RIGHT LEG (Right Leg Lower)  Patient Location: PACU  Anesthesia Type:General  Level of Consciousness: sedated, patient cooperative and responds to stimulation  Airway & Oxygen Therapy: Patient Spontanous Breathing and Patient connected to face mask oxygen  Post-op Assessment: Report given to RN and Post -op Vital signs reviewed and stable  Post vital signs: Reviewed and stable  Last Vitals:  Vitals Value Taken Time  BP 123/86 07/03/2018 12:30 PM  Temp 36.7 C 07/03/2018 12:30 PM  Pulse 53 07/03/2018 12:31 PM  Resp 14 07/03/2018 12:31 PM  SpO2 100 % 07/03/2018 12:31 PM  Vitals shown include unvalidated device data.  Last Pain:  Vitals:   07/03/18 1027  TempSrc:   PainSc: 0-No pain      Patients Stated Pain Goal: 4 (38/32/91 9166)  Complications: No apparent anesthesia complications

## 2018-07-03 NOTE — Anesthesia Procedure Notes (Signed)
Procedure Name: LMA Insertion Date/Time: 07/03/2018 11:44 AM Performed by: Jearld Pies, CRNA Pre-anesthesia Checklist: Patient identified, Emergency Drugs available, Suction available and Patient being monitored Patient Re-evaluated:Patient Re-evaluated prior to induction Oxygen Delivery Method: Circle System Utilized Preoxygenation: Pre-oxygenation with 100% oxygen Induction Type: IV induction Ventilation: Mask ventilation without difficulty LMA: LMA inserted LMA Size: 4.0 Number of attempts: 1 Airway Equipment and Method: Bite block Placement Confirmation: positive ETCO2 Tube secured with: Tape Dental Injury: Teeth and Oropharynx as per pre-operative assessment

## 2018-07-03 NOTE — Anesthesia Preprocedure Evaluation (Addendum)
Anesthesia Evaluation  Patient identified by MRN, date of birth, ID band Patient awake    Reviewed: Allergy & Precautions, NPO status , Patient's Chart, lab work & pertinent test results  History of Anesthesia Complications (+) PONV and history of anesthetic complications  Airway Mallampati: I  TM Distance: >3 FB Neck ROM: Full    Dental no notable dental hx.    Pulmonary neg pulmonary ROS,    Pulmonary exam normal breath sounds clear to auscultation       Cardiovascular negative cardio ROS Normal cardiovascular exam Rhythm:Regular Rate:Normal     Neuro/Psych  Headaches, negative psych ROS   GI/Hepatic negative GI ROS, Neg liver ROS,   Endo/Other  negative endocrine ROS  Renal/GU negative Renal ROS     Musculoskeletal negative musculoskeletal ROS (+)   Abdominal   Peds  Hematology negative hematology ROS (+)   Anesthesia Other Findings Exercise Induced Compatment Syndrome  Reproductive/Obstetrics hcg negative                            Anesthesia Physical Anesthesia Plan  ASA: I  Anesthesia Plan: General   Post-op Pain Management:    Induction: Intravenous  PONV Risk Score and Plan: 4 or greater and Midazolam, Dexamethasone, Ondansetron and Treatment may vary due to age or medical condition  Airway Management Planned: LMA  Additional Equipment:   Intra-op Plan:   Post-operative Plan: Extubation in OR  Informed Consent: I have reviewed the patients History and Physical, chart, labs and discussed the procedure including the risks, benefits and alternatives for the proposed anesthesia with the patient or authorized representative who has indicated his/her understanding and acceptance.   Dental advisory given  Plan Discussed with: CRNA  Anesthesia Plan Comments:         Anesthesia Quick Evaluation

## 2018-07-04 ENCOUNTER — Encounter (HOSPITAL_COMMUNITY): Payer: Self-pay | Admitting: Orthopedic Surgery

## 2018-07-16 ENCOUNTER — Ambulatory Visit (INDEPENDENT_AMBULATORY_CARE_PROVIDER_SITE_OTHER): Payer: BLUE CROSS/BLUE SHIELD | Admitting: Orthopedic Surgery

## 2018-07-16 ENCOUNTER — Encounter (INDEPENDENT_AMBULATORY_CARE_PROVIDER_SITE_OTHER): Payer: Self-pay | Admitting: Orthopedic Surgery

## 2018-07-16 VITALS — Ht 67.0 in | Wt 122.0 lb

## 2018-07-16 DIAGNOSIS — M79A21 Nontraumatic compartment syndrome of right lower extremity: Secondary | ICD-10-CM

## 2018-07-16 DIAGNOSIS — Z4889 Encounter for other specified surgical aftercare: Secondary | ICD-10-CM

## 2018-07-27 ENCOUNTER — Encounter (INDEPENDENT_AMBULATORY_CARE_PROVIDER_SITE_OTHER): Payer: Self-pay | Admitting: Orthopedic Surgery

## 2018-07-27 NOTE — Progress Notes (Signed)
   Office Visit Note   Patient: Teresa Garrett           Date of Birth: Oct 26, 1995           MRN: [id number] Visit Date: 07/16/2018              Requested by: No referring provider defined for this encounter. PCP: Patient, No Pcp Per  Chief Complaint  Patient presents with  . 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  She states she feels well feels a little stiff.  Assessment & Plan: Visit Diagnoses:  1. Exertional compartment syndrome of right lower extremity     Plan: The incision is well-healed we will have her trainer advance her activities as tolerated.  Recommended scar massage.  Follow-Up Instructions: Return if symptoms worsen or fail to improve.   Ortho Exam  Patient is alert, oriented, no adenopathy, well-dressed, normal affect, normal respiratory effort. Examination patient's right lower extremity is neurovascular intact the incision is well-healed there is no drainage no cellulitis no keloiding of the scar she has good range of motion of her ankle.  Imaging: No results found. No images are attached to the encounter.  Labs: No results found for: HGBA1C, ESRSEDRATE, CRP, LABURIC, REPTSTATUS, GRAMSTAIN, CULT, LABORGA   Lab Results  Component Value Date   ALBUMIN 4.1 05/06/2016    Body mass index is 19.11 kg/m.  Orders:  No orders of the defined types were placed in this encounter.  No orders of the defined types were placed in this encounter.    Procedures: No procedures performed  Clinical Data: No additional findings.  ROS:  All other systems negative, except as noted in the HPI. Review of Systems  Objective: Vital Signs: Ht 5\' 7"  (1.702 m)   Wt 122 lb (55.3 kg)   LMP 06/20/2018 (Exact Date)   BMI  19.11 kg/m   Specialty Comments:  No specialty comments available.  PMFS History: Patient Active Problem List   Diagnosis Date Noted  . Compartment syndrome of lower extremity due to exertion, right   . Medial tibial stress syndrome, left, initial encounter 04/30/2016   Past Medical History:  Diagnosis Date  . Asthma    as a child  . Compartment syndrome of right lower extremity ([place])   . Complication of anesthesia   . Concussion   . Headache    migraines-   . PONV (postoperative nausea and vomiting)     History reviewed. No pertinent family history.  Past Surgical History:  Procedure Laterality Date  . FASCIOTOMY Right 07/03/2018   Procedure: ANTERIOR AND LATERAL COMPARTMENT RELEASE RIGHT LEG;  Surgeon: Newt Minion, MD;  Location: [facility];  Service: Orthopedics;  Laterality: Right;  . WISDOM TOOTH EXTRACTION     "gas"   Social History   Occupational History  . Not on file  Tobacco Use  . Smoking status: Never Smoker  . Smokeless tobacco: Never Used  Substance and Sexual Activity  . Alcohol use: No  . Drug use: No  . Sexual activity: Yes    Birth control/protection: None

## 2018-09-08 DIAGNOSIS — H66002 Acute suppurative otitis media without spontaneous rupture of ear drum, left ear: Secondary | ICD-10-CM | POA: Diagnosis not present

## 2018-09-08 DIAGNOSIS — J029 Acute pharyngitis, unspecified: Secondary | ICD-10-CM | POA: Diagnosis not present

## 2018-11-11 ENCOUNTER — Encounter: Payer: Self-pay | Admitting: *Deleted

## 2018-11-12 ENCOUNTER — Ambulatory Visit (INDEPENDENT_AMBULATORY_CARE_PROVIDER_SITE_OTHER): Payer: BC Managed Care – PPO | Admitting: Internal Medicine

## 2018-11-12 ENCOUNTER — Encounter: Payer: Self-pay | Admitting: Internal Medicine

## 2018-11-12 ENCOUNTER — Other Ambulatory Visit: Payer: Self-pay

## 2018-11-12 VITALS — Ht 67.0 in | Wt 130.0 lb

## 2018-11-12 DIAGNOSIS — K5909 Other constipation: Secondary | ICD-10-CM

## 2018-11-12 MED ORDER — LINACLOTIDE 72 MCG PO CAPS: 72.0000 ug | ORAL_CAPSULE | Freq: Every day | ORAL | 1 refills | Status: DC

## 2018-11-12 NOTE — Patient Instructions (Addendum)
Your provider has requested that you go to the basement level for lab work. Press "B" on the elevator. The lab is located at the first door on the left as you exit the elevator.  Continue fiber gummy supplementation and liberal fluid intake.  We have sent the following medications to your pharmacy for you to pick up at your convenience: Linzess 72 mcg daily, 30 minutes before breakfast.  Please try this for 7 to 10 days and let me know if you have a side effect such as diarrhea or if it is ineffective where we would need to dose titrate.  Please follow up with Dr Hilarie Fredrickson in 2 months in the office.

## 2018-11-12 NOTE — Progress Notes (Signed)
Patient ID: Teresa Garrett, female   DOB: 1995-12-16, 23 y.o.   MRN: [id number]  This service was provided via telemedicine.  Doximity App with AV communication The patient was located at home The provider was located in provider's GI office. The patient did consent to this telephone visit and is aware of possible charges through their insurance for this visit.   The persons participating in this telemedicine service were the patient and I. Time spent on call: 19 min  HPI: Teresa Garrett is a 23 year old female with little past medical history other than lower extremity compartment syndrome status post surgery to relieve compartment syndrome in January 2020 who is seen to evaluate constipation.  She is seen by Doximity app with A/V communication rather than in person in the setting of COVID-19 pandemic.  She reports that around October 2019 she developed constipation.  This does correspond to the time that she got sick with her lower extremity compartment syndrome.  She eventually underwent surgery in January of this year.  Prior to October her usual bowel habit was 1 or 2 bowel movements per day but over the last 6 to 8 months she has developed constipation.  She reports that her stools are less frequent, harder and more painful to pass.  Her frequency was a bowel movement every 4 to 5 days.  This was associated with lower abdominal discomfort, bloating and also discomfort under her rib cage.  She started using a women's laxative 1 a day at first increasing to twice a day and with this was still having a bowel movement only every 3 to 4 days.  Often with laxatives she would have hard stool followed by loose stool and even diarrhea.  In January when she had her surgery she used a pain medication and they recommended a stool softener which she took for about a month.  It helped a little bit but after stopping both the pain medication and stool softener her bowel habits returned to more constipated  state.  Her weight is up about 12 pounds in the last 6 months or so having normally been stable around 130 pound.  Her periods are regular but this is not new for her with her last menstrual cycle being 10/15/2018.  She denies seeing blood in her stool and melena.  No upper GI or hepatobiliary complaint.  No GI family history of note.  She enjoys running but has not been back to full running since her surgery.  She is working part-time for [employer].  Past Medical History:  Diagnosis Date  . Asthma    as a child  . Compartment syndrome of right lower extremity ([place])   . Complication of anesthesia   . Concussion   . Headache    migraines-   . PONV (postoperative nausea and vomiting)     Past Surgical History:  Procedure Laterality Date  . FASCIOTOMY Right 07/03/2018   Procedure: ANTERIOR AND LATERAL COMPARTMENT RELEASE RIGHT LEG;  Surgeon: Newt Minion, MD;  Location: [facility];  Service: Orthopedics;  Laterality: Right;  . WISDOM TOOTH EXTRACTION     "gas"    No outpatient medications prior to visit.   No facility-administered medications prior to visit.     Allergies  Allergen Reactions  . Apple Itching and Nausea Only    Family History  Problem Relation Age of Onset  . Colon cancer Neg Hx   . Esophageal cancer Neg Hx   . Pancreatic cancer Neg Hx   .  Stomach cancer Neg Hx   . Liver disease Neg Hx     Social History   Tobacco Use  . Smoking status: Never Smoker  . Smokeless tobacco: Never Used  Substance Use Topics  . Alcohol use: No  . Drug use: No    ROS: As per history of present illness, otherwise negative  Ht 5\' 7"  (1.702 m)   Wt 130 lb (59 kg)   BMI 20.36 kg/m  No PE, virtual visit  RELEVANT LABS AND IMAGING: CBC    Component Value Date/Time   WBC 12.7 (H) 05/06/2016 1707   RBC 4.50 05/06/2016 1707   HGB 13.9 07/03/2018 0952   HCT 39.5 05/06/2016 1707   PLT 222 05/06/2016 1707   MCV 87.8 05/06/2016 1707   MCH 28.9 05/06/2016 1707   MCHC 32.9  05/06/2016 1707   RDW 13.7 05/06/2016 1707    ASSESSMENT/PLAN: 23 year old female with little past medical history other than lower extremity compartment syndrome status post surgery to relieve compartment syndrome in January 2020 who is seen to evaluate constipation.  1. Constipation --constipation over the last 7 or 8 months, thus chronic.  No alarm symptoms.  Query whether with her activity change her bowels have followed suit.  I would like to exclude hypothyroidism and also celiac disease, the latter can present rarely with constipation.  She is already eating fiber Gummies per day without benefit.  I recommended the following --TSH and celiac panel --Continue fiber gummy supplementation and liberal fluid intake --Begin Linzess 72 mcg daily, 30 minutes before breakfast.  I asked that she try this for 7 to 10 days and let me know if she has side effect such as diarrhea or if it is ineffective where we would dose titrate --Follow-up in about 2 months for continuity and to see how she is doing

## 2018-11-12 NOTE — Addendum Note (Signed)
Addended by: Larina Bras on: 11/12/2018 04:00 PM   Modules accepted: Orders

## 2018-11-18 ENCOUNTER — Other Ambulatory Visit (INDEPENDENT_AMBULATORY_CARE_PROVIDER_SITE_OTHER): Payer: BLUE CROSS/BLUE SHIELD

## 2018-11-18 DIAGNOSIS — K5909 Other constipation: Secondary | ICD-10-CM

## 2018-11-18 LAB — TSH: TSH: 3.02 u[IU]/mL (ref 0.35–4.50)

## 2018-11-18 LAB — IGA: IgA: 116 mg/dL (ref 68–378)

## 2018-11-19 LAB — TISSUE TRANSGLUTAMINASE, IGA: (tTG) Ab, IgA: 1 U/mL

## 2018-11-25 NOTE — Telephone Encounter (Signed)
Tell the patient I received her email We could try the next higher dose of Linzess which would be 145 mcg daily to see if she has more complete evacuation I would like to see her in the office, next available, for follow-up this will allow for appropriate physical examination Linzess has been proven to help with abdominal pain but the improvement in abdominal pain tends to lag behind the constipation response.  That said if abdominal pain continues we will work-up further

## 2018-11-26 ENCOUNTER — Other Ambulatory Visit: Payer: Self-pay

## 2018-11-26 MED ORDER — LINACLOTIDE 145 MCG PO CAPS: 145.0000 ug | ORAL_CAPSULE | Freq: Every day | ORAL | 3 refills | Status: DC

## 2018-12-28 ENCOUNTER — Telehealth: Payer: Self-pay | Admitting: Internal Medicine

## 2018-12-28 ENCOUNTER — Encounter: Payer: Self-pay | Admitting: *Deleted

## 2018-12-28 NOTE — Progress Notes (Signed)
Went over prescreening information. Patient later explained that she thought she was coming to the office for appointment. She would prefer this. Changed patient to in office visit.

## 2018-12-30 ENCOUNTER — Other Ambulatory Visit: Payer: Self-pay

## 2018-12-30 ENCOUNTER — Encounter: Payer: Self-pay | Admitting: Internal Medicine

## 2018-12-30 ENCOUNTER — Ambulatory Visit (INDEPENDENT_AMBULATORY_CARE_PROVIDER_SITE_OTHER): Payer: BC Managed Care – PPO | Admitting: Internal Medicine

## 2018-12-30 VITALS — BP 98/62 | HR 68 | Temp 98.1°F | Ht 67.0 in | Wt 134.4 lb

## 2018-12-30 DIAGNOSIS — R1030 Lower abdominal pain, unspecified: Secondary | ICD-10-CM | POA: Diagnosis not present

## 2018-12-30 DIAGNOSIS — K5900 Constipation, unspecified: Secondary | ICD-10-CM | POA: Diagnosis not present

## 2018-12-30 MED ORDER — LINACLOTIDE 290 MCG PO CAPS: 290.0000 ug | ORAL_CAPSULE | Freq: Every day | ORAL | 2 refills | Status: DC

## 2018-12-30 MED ORDER — SUPREP BOWEL PREP KIT 17.5-3.13-1.6 GM/177ML PO SOLN: 1.0000 | ORAL | 0 refills | Status: DC

## 2018-12-30 NOTE — Progress Notes (Signed)
   Subjective:    Patient ID: Teresa Garrett, female    DOB: [birth year], 23 y.o.   MRN: [id number]  HPI Teresa Garrett is a 23 year old female with a history of constipation over the last 9 months, history of lower extremity compartment syndrome who is seen for follow-up.  She was seen by virtual visit on 11/12/2018.  She is seen in person today and she is here alone.  At the time of her last visit we discussed constipation and lower abdominal pain.  We checked a TSH which was normal and a celiac panel which was negative.  She has continued fiber supplementation and we also started her on Linzess initially 72 mcg daily.  This was increased to 145 mcg daily.  She is still struggling with constipation lower abdominal pain.  Bowel movements are inconsistent.  She can still go 3 to 4 days without having a bowel movement and when she goes that long she can get pain in her upper abdomen as well.  This is a pressure type discomfort.  Other times stools can be hard and difficult to pass and then on some days she will have more of diarrhea as a result of the Linzess.  She feels that she has a normal bowel movement less than 1 day a week.  She is noticed some belching and nausea particular if she is very active such as rolling around and playing with her dog after eating.  No dysphagia or odynophagia.  She continues to be very active with running 30 to 45 minutes a day often 4 to 6 miles per day.  Last menstrual cycle was on 12/16/2018 though chronically her menstrual cycles are regular.  She has not seen blood in her stool.   Review of Systems As per HPI, otherwise negative  Current Medications, Allergies, Past Medical History, Past Surgical History, Family History and Social History were reviewed in [employer] record.     Objective:   Physical Exam BP 98/62 (BP Location: Left Arm, Patient Position: Sitting, Cuff Size: Normal)   Pulse 68   Temp 98.1 F (36.7 C) (Other (Comment))  Comment (Src): infared thermascan  Ht 5\' 7"  (1.702 m)   Wt 134 lb 6 oz (61 kg)   BMI 21.05 kg/m   Gen: awake, alert, NAD HEENT: anicteric, op clear CV: RRR, no mrg Pulm: CTA b/l Abd: soft, lower abdominal pain with moderate palpation without rebound or guarding, nondistended, +BS throughout Ext: no c/c/e Neuro: nonfocal      Assessment & Plan:  23 year old female with a history of constipation over the last 9 months, history of lower extremity compartment syndrome who is seen for follow-up.  1.  Constipation with lower abdominal pain --she has not responded ideally to Linzess either at 72 or 145 mcg daily.  She is concerned because of the somewhat dramatic change in her bowel habits over the last 9 months.  There was no evidence of celiac disease or thyroid dysfunction.  Given her ongoing symptoms and before considering additional medical therapy I have recommended colonoscopy.  We discussed the risk, benefits and alternatives and she is agreeable wishes to proceed.  In the meantime increase Linzess to 290 mcg daily --Colonoscopy in the [facility] --Increase Linzess to 290 mcg daily until colonoscopy  25 minutes spent with the patient today. Greater than 50% was spent in counseling and coordination of care with the patient

## 2018-12-30 NOTE — Patient Instructions (Signed)
You have been scheduled for a colonoscopy. Please follow written instructions given to you at your visit today.  Please pick up your prep supplies at the pharmacy within the next 1-3 days. If you use inhalers (even only as needed), please bring them with you on the day of your procedure. Your physician has requested that you go to www.startemmi.com and enter the access code given to you at your visit today. This web site gives a general overview about your procedure. However, you should still follow specific instructions given to you by our office regarding your preparation for the procedure.  We have sent the following medications to your pharmacy for you to pick up at your convenience: Linzess 290 mcg once daily (increase from Linzess 145 mcg dosage)  If you are age 50 or older, your body mass index should be between 23-30. Your Body mass index is 21.05 kg/m. If this is out of the aforementioned range listed, please consider follow up with your Primary Care Provider.  If you are age 41 or younger, your body mass index should be between 19-25. Your Body mass index is 21.05 kg/m. If this is out of the aformentioned range listed, please consider follow up with your Primary Care Provider.

## 2019-01-01 ENCOUNTER — Telehealth: Payer: Self-pay | Admitting: Internal Medicine

## 2019-01-01 NOTE — Telephone Encounter (Signed)

## 2019-01-04 ENCOUNTER — Encounter: Payer: Self-pay | Admitting: Internal Medicine

## 2019-01-04 ENCOUNTER — Ambulatory Visit (AMBULATORY_SURGERY_CENTER): Payer: BC Managed Care – PPO | Admitting: Internal Medicine

## 2019-01-04 ENCOUNTER — Other Ambulatory Visit: Payer: Self-pay

## 2019-01-04 VITALS — BP 100/68 | HR 40 | Temp 98.1°F | Resp 17 | Ht 67.0 in | Wt 134.0 lb

## 2019-01-04 DIAGNOSIS — R1030 Lower abdominal pain, unspecified: Secondary | ICD-10-CM

## 2019-01-04 DIAGNOSIS — D124 Benign neoplasm of descending colon: Secondary | ICD-10-CM

## 2019-01-04 DIAGNOSIS — K635 Polyp of colon: Secondary | ICD-10-CM

## 2019-01-04 DIAGNOSIS — K5900 Constipation, unspecified: Secondary | ICD-10-CM | POA: Diagnosis not present

## 2019-01-04 DIAGNOSIS — D122 Benign neoplasm of ascending colon: Secondary | ICD-10-CM

## 2019-01-04 MED ORDER — SODIUM CHLORIDE 0.9 % IV SOLN: 500.0000 mL | Freq: Once | INTRAVENOUS | Status: DC

## 2019-01-04 NOTE — Progress Notes (Signed)
Report given to PACU, vss 

## 2019-01-04 NOTE — Progress Notes (Signed)
Called to room to assist during endoscopic procedure.  Patient ID and intended procedure confirmed with present staff. Received instructions for my participation in the procedure from the performing physician.  

## 2019-01-04 NOTE — Patient Instructions (Signed)
Impression/Recommendations:  Polyp handout given to patient. High fiber diet handout given to patient.  Resume previous diet.  Await pathology results.  Repeat colonoscopy recommended.  Date to be determined after pathology results reviewed.  YOU HAD AN ENDOSCOPIC PROCEDURE TODAY AT [facility]:   Refer to the procedure report that was given to you for any specific questions about what was found during the examination.  If the procedure report does not answer your questions, please call your gastroenterologist to clarify.  If you requested that your care partner not be given the details of your procedure findings, then the procedure report has been included in a sealed envelope for you to review at your convenience later.  YOU SHOULD EXPECT: Some feelings of bloating in the abdomen. Passage of more gas than usual.  Walking can help get rid of the air that was put into your GI tract during the procedure and reduce the bloating. If you had a lower endoscopy (such as a colonoscopy or flexible sigmoidoscopy) you may notice spotting of blood in your stool or on the toilet paper. If you underwent a bowel prep for your procedure, you may not have a normal bowel movement for a few days.  Please Note:  You might notice some irritation and congestion in your nose or some drainage.  This is from the oxygen used during your procedure.  There is no need for concern and it should clear up in a day or so.  SYMPTOMS TO REPORT IMMEDIATELY:   Following lower endoscopy (colonoscopy or flexible sigmoidoscopy):  Excessive amounts of blood in the stool  Significant tenderness or worsening of abdominal pains  Swelling of the abdomen that is new, acute  Fever of 100F or higher For urgent or emergent issues, a gastroenterologist can be reached at any hour by calling [phone].   DIET:  We do recommend a small meal at first, but then you may proceed to your regular diet.  Drink plenty of  fluids but you should avoid alcoholic beverages for 24 hours.  ACTIVITY:  You should plan to take it easy for the rest of today and you should NOT DRIVE or use heavy machinery until tomorrow (because of the sedation medicines used during the test).    FOLLOW UP: Our staff will call the number listed on your records 48-72 hours following your procedure to check on you and address any questions or concerns that you may have regarding the information given to you following your procedure. If we do not reach you, we will leave a message.  We will attempt to reach you two times.  During this call, we will ask if you have developed any symptoms of COVID 19. If you develop any symptoms (ie: fever, flu-like symptoms, shortness of breath, cough etc.) before then, please call [phone].  If you test positive for Covid 19 in the 2 weeks post procedure, please call and report this information to [place].    If any biopsies were taken you will be contacted by phone or by letter within the next 1-3 weeks.  Please call us at [phone] if you have not heard about the biopsies in 3 weeks.    SIGNATURES/CONFIDENTIALITY: You and/or your care partner have signed paperwork which will be entered into your electronic medical record.  These signatures attest to the fact that that the information above on your After Visit Summary has been reviewed and is understood.  Full responsibility of the confidentiality of this discharge  information lies with you and/or your care-partner.

## 2019-01-04 NOTE — Op Note (Signed)
[facility] Patient Name: Teresa Garrett Procedure Date: 01/04/2019 8:08 AM MRN: [id number] Endoscopist: Jerene Bears , MD Age: 23 Referring MD:  Date of Birth: 1996-01-19 Gender: Female Account #: [id number] Procedure:                Colonoscopy Indications:              Lower abdominal pain, Constipation Medicines:                Monitored Anesthesia Care Procedure:                Pre-Anesthesia Assessment:                           - Prior to the procedure, a History and Physical                            was performed, and patient medications and                            allergies were reviewed. The patient's tolerance of                            previous anesthesia was also reviewed. The risks                            and benefits of the procedure and the sedation                            options and risks were discussed with the patient.                            All questions were answered, and informed consent                            was obtained. Prior Anticoagulants: The patient has                            taken no previous anticoagulant or antiplatelet                            agents. ASA Grade Assessment: I - A normal, healthy                            patient. After reviewing the risks and benefits,                            the patient was deemed in satisfactory condition to                            undergo the procedure.                           After obtaining informed consent, the colonoscope  was passed under direct vision. Throughout the                            procedure, the patient's blood pressure, pulse, and                            oxygen saturations were monitored continuously. The                            Colonoscope was introduced through the anus and                            advanced to the terminal ileum. The colonoscopy was                            performed without difficulty. The  patient tolerated                            the procedure well. The quality of the bowel                            preparation was good. The terminal ileum, ileocecal                            valve, appendiceal orifice, and rectum were                            photographed. Scope In: 8:15:54 AM Scope Out: 8:46:10 AM Scope Withdrawal Time: 0 hours 26 minutes 9 seconds  Total Procedure Duration: 0 hours 30 minutes 16 seconds  Findings:                 The perianal and digital rectal examinations were                            normal.                           The terminal ileum appeared normal.                           A 6 mm polyp with mucus cap was found in the                            ascending colon. The polyp was sessile. The polyp                            was removed with a cold snare. Resection and                            retrieval were complete.                           A 4 mm polyp was found in the proximal descending  colon. The polyp was sessile and probably                            submucosal.. The polyp was removed with a cold                            snare. Resection and retrieval were complete.                           The exam was otherwise without abnormality on                            direct and retroflexion views. Complications:            No immediate complications. Estimated Blood Loss:     Estimated blood loss was minimal. Impression:               - The examined portion of the ileum was normal.                           - One 6 mm polyp in the ascending colon, removed                            with a cold snare. Resected and retrieved.                           - One 4 mm polyp in the proximal descending colon,                            removed with a cold snare. Resected and retrieved.                           - The examination was otherwise normal on direct                            and retroflexion  views. Recommendation:           - Patient has a contact number available for                            emergencies. The signs and symptoms of potential                            delayed complications were discussed with the                            patient. Return to normal activities tomorrow.                            Written discharge instructions were provided to the                            patient.                           -  Resume previous diet with liberal fiber intake.                           - Given less than ideal response to Linzess, if                            constipation continues we can continue trial of                            laxatives as needed (until symptoms improve). After                            evaluation thus far the most likely cause of                            constipation is IBS.                           - Await pathology results.                           - Repeat colonoscopy is recommended. The                            colonoscopy date will be determined after pathology                            results from today's exam become available for                            review. Jerene Bears, MD 01/04/2019 8:52:10 AM This report has been signed electronically.

## 2019-01-04 NOTE — Progress Notes (Signed)
Pt's states no medical or surgical changes since previsit or office visit.  [place]

## 2019-01-06 ENCOUNTER — Telehealth: Payer: Self-pay

## 2019-01-06 NOTE — Telephone Encounter (Signed)
  Follow up Call-  Call back number 01/04/2019  Post procedure Call Back phone  # [phone]  Permission to leave phone message Yes     Patient questions:  Do you have a fever, pain , or abdominal swelling? No. Pain Score  0 *  Have you tolerated food without any problems? Yes.    Have you been able to return to your normal activities? Yes.    Do you have any questions about your discharge instructions: Diet   No. Medications  No. Follow up visit  No.  Do you have questions or concerns about your Care? No.  Actions: * If pain score is 4 or above: 1. No action needed, pain <4.Have you developed a fever since your procedure? no  2.   Have you had an respiratory symptoms (SOB or cough) since your procedure? no  3.   Have you tested positive for COVID 19 since your procedure no  4.   Have you had any family members/close contacts diagnosed with the COVID 19 since your procedure?  no   If yes to any of these questions please route to Joylene John, RN and Alphonsa Gin, Therapist, sports.

## 2019-01-07 DIAGNOSIS — S5002XA Contusion of left elbow, initial encounter: Secondary | ICD-10-CM | POA: Diagnosis not present

## 2019-01-11 ENCOUNTER — Telehealth: Payer: Self-pay | Admitting: *Deleted

## 2019-01-11 ENCOUNTER — Encounter: Payer: Self-pay | Admitting: Internal Medicine

## 2019-01-11 DIAGNOSIS — K635 Polyp of colon: Secondary | ICD-10-CM

## 2019-01-11 NOTE — Telephone Encounter (Signed)
Left message for patient to call back. Genetics referral placed in system.

## 2019-01-11 NOTE — Telephone Encounter (Signed)
-----   Message from Jerene Bears, MD sent at 01/11/2019 10:02 AM EDT ----- Pathology letter sent; patient had one sessile serrated colon polyp which is unexpected at age 23.  This is benign but has precancerous potential.  It was removed.  The second polyp removed was benign and not precancerous.  I recommend genetics referral, we may need to notify her of this if the genetics referral is placed before she receives the pathology letter.  See pathology letter

## 2019-01-13 NOTE — Telephone Encounter (Signed)
I have spoken to patient to advise of pathology information as well as the fact that she will be contacted by genetics team. She verbalizes understanding.

## 2019-01-19 ENCOUNTER — Telehealth: Payer: Self-pay | Admitting: Licensed Clinical Social Worker

## 2019-01-19 NOTE — Telephone Encounter (Signed)
Pt returned call to schedule a genetic counseling appt. I offered a webex visit, but pt preferred to come in the office. Genetic counseling appt has been scheduled for the pt to see Faith Rogue on 8/6 at 9am. She's been made aware to arrive 15 minutes early.

## 2019-01-27 ENCOUNTER — Telehealth: Payer: Self-pay | Admitting: Licensed Clinical Social Worker

## 2019-01-27 NOTE — Telephone Encounter (Signed)
Called patient regarding upcoming Webex appointment, patient would prefer to keep this as a walk-in visit.

## 2019-01-28 ENCOUNTER — Inpatient Hospital Stay: Payer: BC Managed Care – PPO | Attending: Genetic Counselor | Admitting: Licensed Clinical Social Worker

## 2019-01-28 ENCOUNTER — Other Ambulatory Visit: Payer: Self-pay

## 2019-01-28 ENCOUNTER — Inpatient Hospital Stay: Payer: BC Managed Care – PPO

## 2019-01-28 ENCOUNTER — Encounter: Payer: Self-pay | Admitting: Licensed Clinical Social Worker

## 2019-01-28 DIAGNOSIS — Z86010 Personal history of colonic polyps: Secondary | ICD-10-CM

## 2019-01-28 DIAGNOSIS — Z803 Family history of malignant neoplasm of breast: Secondary | ICD-10-CM

## 2019-01-28 DIAGNOSIS — F410 Panic disorder [episodic paroxysmal anxiety] without agoraphobia: Secondary | ICD-10-CM | POA: Diagnosis not present

## 2019-01-28 NOTE — Progress Notes (Signed)
REFERRING PROVIDER: Jerene Bears, MD [address],  [place] [address]  PRIMARY PROVIDER:  Patient, No Pcp Per  PRIMARY REASON FOR VISIT:  1. Family history of breast cancer   2. Personal history of colonic polyps     HISTORY OF PRESENT ILLNESS:   [name], a 23 y.o. female, was seen for a [place] cancer genetics consultation at the request of Dr. Hilarie Fredrickson due to a personal history of polyps.  [name] presents to clinic today to discuss the possibility of a hereditary predisposition to cancer, genetic testing, and to further clarify her future cancer risks, as well as potential cancer risks for family members.    [name] is a 23 y.o. female with no personal history of cancer.  In 2020 she had a colonoscopy that revealed 2 polyps. One was found to be a sessile serrated polyp and the other benign.   RISK FACTORS:  Menarche was at age 7.  First live birth at age no children.  OCP use for approximately 1 years.  Ovaries intact: yes.  Hysterectomy: no.  Menopausal status: premenopausal.  HRT use: 0 years. Colonoscopy: yes; 1 sessile serrated polyp. Mammogram within the last year: no. Number of breast biopsies: 0. Any excessive radiation exposure in the past: no  Past Medical History:  Diagnosis Date  . Asthma    as a child  . 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 Complication of anesthesia   . Concussion   . Family history of breast cancer   . Headache    migraines-   . PONV (postoperative nausea and vomiting)     Past Surgical History:  Procedure Laterality Date  . FASCIOTOMY Right 07/03/2018   Procedure: ANTERIOR AND LATERAL COMPARTMENT RELEASE RIGHT LEG;  Surgeon: Newt Minion, MD;  Location: [facility];  Service: Orthopedics;  Laterality: Right;  . WISDOM TOOTH EXTRACTION     "gas"    Social History   Socioeconomic History  . Marital status: Single    Spouse name: Not on file  . Number of children: Not on file  . Years of  education: Not on file  . Highest education level: Not on file  Occupational History  . Not on file  Social Needs  . Financial resource strain: Not on file  . Food insecurity    Worry: Not on file    Inability: Not on file  . Transportation needs    Medical: Not on file    Non-medical: Not on file  Tobacco Use  . Smoking status: Never Smoker  . Smokeless tobacco: Never Used  Substance and Sexual Activity  . Alcohol use: No  . Drug use: No  . Sexual activity: Yes    Birth control/protection: None  Lifestyle  . Physical activity    Days per week: Not on file    Minutes per session: Not on file  . Stress: Not on file  Relationships  . Social Herbalist on phone: Not on file    Gets together: Not on file    Attends religious service: Not on file    Active member of club or organization: Not on file    Attends meetings of clubs or organizations: Not on file    Relationship status: Not on file  Other Topics Concern  . Not on file  Social History Narrative  . Not on file     FAMILY HISTORY:  We obtained a detailed, 4-generation  family history.  Significant diagnoses are listed below: Family History  Problem Relation Age of Onset  . Breast cancer Other        dx 96s  . Colon cancer Neg Hx   . Esophageal cancer Neg Hx   . Pancreatic cancer Neg Hx   . Stomach cancer Neg Hx   . Liver disease Neg Hx     [name] does not have children. She has 2 brothers and 1 sister, no cancers and no colon polyps that she is aware of.   [name] mother is living at 7. She has never had cancer, and patient does not believe she has had a colonoscopy. Patient has 1 maternal aunt, no cancers. She has one maternal cousin, no cancers. Maternal grandmother is living at 85 with no history of cancer or polyps she is aware of. This grandmother's mother had breast cancer in her 68s, and had a recurrence at 40 and passed away. Patient's maternal grandfather is living at 29 and has  diverticulitis, patient unaware if he has had colon polyps.  [name] father is living at 67 and has not had cancer or a colonoscopy that she is aware of. Patient has 2 paternal uncles, no cancers. No cancers in paternal cousins. Her paternal grandmother is 63, paternal grandfather is 67, no cancer or polyps she is aware of.   [name] is unaware of previous family history of genetic testing for hereditary cancer risks. Patient's maternal ancestors are of [place] and [place] descent, and paternal ancestors are of [place] and [place] descent. There is no reported Ashkenazi Jewish ancestry. There is no known consanguinity.  GENETIC COUNSELING ASSESSMENT: [name] is a 23 y.o. female with a personal and family history which is somewhat suggestive of a hereditary cancer syndrome/hereditary polyposis syndrome and predisposition to cancer. We, therefore, discussed and recommended the following at today's visit.   DISCUSSION:  We discussed that polyps in general are common, however, most people have fewer than 5 lifetime polyps.  When an individual has 10 or more polyps we become concerned about an underlying polyposis syndrome. We discussed that while she has not had many polyps, given her age she could consider testing. The most common hereditary polyposis syndromes are caused by problems in the APC and MUTYH genes, however, more recently, mutations in the [place] and MSH3 genes have been identified in some polyposis families.  We also discussed that 5 - 10% of breast cancer is hereditary, with most cases associated with BRCA1/BRCA2 mutations.  There are other genes that can be associated with hereditary breast cancer syndromes.   We reviewed the characteristics, features and inheritance patterns of hereditary cancer syndromes. We also discussed genetic testing, including the appropriate family members to test, the process of testing, insurance coverage and turn-around-time for results. We discussed the  implications of a negative, positive and/or variant of uncertain significant result. We recommended [name] pursue genetic testing for the Multi-Cancer gene panel.   The Multi-Cancer Panel offered by [facility] includes sequencing and/or deletion duplication testing of the following 85 genes: AIP, ALK, APC, ATM, AXIN2,BAP1,  BARD1, BLM, BMPR1A, BRCA1, BRCA2, BRIP1, CASR, CDC73, CDH1, CDK4, CDKN1B, CDKN1C, CDKN2A (p14ARF), CDKN2A (p16INK4a), CEBPA, CHEK2, CTNNA1, DICER1, DIS3L2, EGFR (c.2369C>T, p.Thr790Met variant only), EPCAM (Deletion/duplication testing only), FH, FLCN, GATA2, GPC3, GREM1 (Promoter region deletion/duplication testing only), HOXB13 (c.251G>A, p.Gly84Glu), HRAS, KIT, MAX, MEN1, MET, MITF (c.952G>A, p.Glu318Lys variant only), MLH1, MSH2, MSH3, MSH6, MUTYH, NBN, NF1, NF2, NTHL1, PALB2, PDGFRA, PHOX2B, PMS2, POLD1,  POLE, POT1, PRKAR1A, PTCH1, PTEN, RAD50, RAD51C, RAD51D, RB1, RECQL4, RET, RNF43, RUNX1, SDHAF2, SDHA (sequence changes only), SDHB, SDHC, SDHD, SMAD4, SMARCA4, SMARCB1, SMARCE1, STK11, SUFU, TERC, TERT, TMEM127, TP53, TSC1, TSC2, VHL, WRN and WT1.   Based on Ms. McGee's family history of cancer, she meets medical criteria for genetic testing. Despite that she meets criteria, she may still have an out of pocket cost.   PLAN: After considering the risks, benefits, and limitations, Ms. McGee provided informed consent to pursue genetic testing and the blood sample was sent to [facility] for analysis of the Multi-Cancer Panel. Results should be available within approximately 2-3 weeks' time, at which point they will be disclosed by telephone to [name], as will any additional recommendations warranted by these results. [name] will receive a summary of her genetic counseling visit and a copy of her results once available. This information will also be available in Epic.   Based on Ms. McGee's family history, we recommended her maternal relatives have genetic counseling and  testing. [name] will let us know if we can be of any assistance in coordinating genetic counseling and/or testing for this family member.   Lastly, we encouraged Ms. McGee to remain in contact with cancer genetics annually so that we can continuously update the family history and inform her of any changes in cancer genetics and testing that may be of benefit for this family.   [name] questions were answered to her satisfaction today. Our contact information was provided should additional questions or concerns arise. Thank you for the referral and allowing [place] to share in the care of your patient.   Faith Rogue, MS, [facility] Genetic Counselor [email or web address] Phone: [phone]  The patient was seen for a total of 35 minutes in face-to-face genetic counseling.  Drs. Magrinat, Lindi Adie and/or Burr Medico were available for discussion regarding this case.   _______________________________________________________________________ For Office Staff:  Number of people involved in session: 1 Was an Intern/ student involved with case: no

## 2019-02-04 ENCOUNTER — Telehealth: Payer: Self-pay | Admitting: Internal Medicine

## 2019-02-04 MED ORDER — LUBIPROSTONE 24 MCG PO CAPS: 24.0000 ug | ORAL_CAPSULE | Freq: Two times a day (BID) | ORAL | 3 refills | Status: DC

## 2019-02-04 NOTE — Telephone Encounter (Signed)
Pt states that she has not had a bm since her procedure, she states that Dr. Hilarie Fredrickson told her not to take any laxatives so she would like some advise on what she can take.

## 2019-02-04 NOTE — Telephone Encounter (Signed)
Pt scheduled for OV with Dr. Hilarie Fredrickson 03/09/19@1 :30pm. Appt letter mailed to pt.

## 2019-02-04 NOTE — Telephone Encounter (Signed)
From my recollection, she was using linzess before the procedure, but after trying several doses she never achieved a predictably bowel pattern Please confirm with her. If she did like the linzess response, then can resume at last effective dose.  If not, let's try Amitiza 24 mcg BID with food.  Should not expect all diarrhea, but rather complete and spontaneous BMs  She can call back with an update and let me know how she is doing/responding OV next available

## 2019-02-04 NOTE — Telephone Encounter (Signed)
Pt states she has not had a BM in 11 days. Per procedure note if pt still having issues with constipation laxatives can be tried. Instructed pt to do a miralax purge and to then try 1-2 doses of miralax daily. She verbalized understanding and Dr. Hilarie Fredrickson notified.

## 2019-02-04 NOTE — Telephone Encounter (Signed)
Spoke with pt and she is aware. States she did try the linzess and it did not work well for her. She will try the amitiza and call back with an update.

## 2019-02-15 ENCOUNTER — Telehealth: Payer: Self-pay | Admitting: Licensed Clinical Social Worker

## 2019-03-02 ENCOUNTER — Encounter: Payer: Self-pay | Admitting: Licensed Clinical Social Worker

## 2019-03-02 ENCOUNTER — Ambulatory Visit: Payer: Self-pay | Admitting: Licensed Clinical Social Worker

## 2019-03-02 DIAGNOSIS — Z803 Family history of malignant neoplasm of breast: Secondary | ICD-10-CM

## 2019-03-02 DIAGNOSIS — Z1379 Encounter for other screening for genetic and chromosomal anomalies: Secondary | ICD-10-CM

## 2019-03-02 DIAGNOSIS — Z86010 Personal history of colonic polyps: Secondary | ICD-10-CM

## 2019-03-02 NOTE — Telephone Encounter (Signed)
Revealed negative genetic testing.   This normal result is reassuring and indicates that it is unlikely Teresa Garrett's polyps are due to a hereditary cause.  It is unlikely that there is an increased risk of cancer due to a mutation in one of these genes.  However, genetic testing is not perfect, and cannot definitively rule out a hereditary cause.  It will be important for her to keep in contact with genetics to learn if any additional testing may be needed in the future.

## 2019-03-02 NOTE — Progress Notes (Addendum)
HPI:  [name] was previously seen in the [facility] clinic due to a personal history of polyps and concerns regarding a hereditary predisposition to cancer. Please refer to our prior cancer genetics clinic note for more information regarding our discussion, assessment and recommendations, at the time. [name] recent genetic test results were disclosed to her, as were recommendations warranted by these results. These results and recommendations are discussed in more detail below.   [name] is a 23 y.o. female with no personal history of cancer.  In 2020 she had a colonoscopy that revealed 2 polyps. One was found to be a sessile serrated polyp and the other benign.   CANCER HISTORY:  Oncology History   No history exists.    FAMILY HISTORY:  We obtained a detailed, 4-generation family history.  Significant diagnoses are listed below: Family History  Problem Relation Age of Onset  . Breast cancer Other        dx 53s  . Colon cancer Neg Hx   . Esophageal cancer Neg Hx   . Pancreatic cancer Neg Hx   . Stomach cancer Neg Hx   . Liver disease Neg Hx     [name] does not have children. She has 2 brothers and 1 sister, no cancers and no colon polyps that she is aware of.   [name] mother is living at 5. She has never had cancer, and patient does not believe she has had a colonoscopy. Patient has 1 maternal aunt, no cancers. She has one maternal cousin, no cancers. Maternal grandmother is living at 1 with no history of cancer or polyps she is aware of. This grandmother's mother had breast cancer in her 7s, and had a recurrence at 34 and passed away. Patient's maternal grandfather is living at 10 and has diverticulitis, patient unaware if he has had colon polyps.  [name] father is living at 1 and has not had cancer or a colonoscopy that she is aware of. Patient has 2 paternal uncles, no cancers. No cancers in paternal cousins. Her paternal grandmother is 39,  paternal grandfather is 51, no cancer or polyps she is aware of.   [name] is unaware of previous family history of genetic testing for hereditary cancer risks. Patient's maternal ancestors are of [place] and [place] descent, and paternal ancestors are of [place] and [place] descent. There is no reported Ashkenazi Jewish ancestry. There is no known consanguinity.  GENETIC TEST RESULTS: Genetic testing reported out on 02/08/2019 through the [facility] Multi- cancer panel found no pathogenic mutations. The Multi-Cancer Panel offered by [facility] includes sequencing and/or deletion duplication testing of the following 85 genes: AIP, ALK, APC, ATM, AXIN2,BAP1,  BARD1, BLM, BMPR1A, BRCA1, BRCA2, BRIP1, CASR, CDC73, CDH1, CDK4, CDKN1B, CDKN1C, CDKN2A (p14ARF), CDKN2A (p16INK4a), CEBPA, CHEK2, CTNNA1, DICER1, DIS3L2, EGFR (c.2369C>T, p.Thr790Met variant only), EPCAM (Deletion/duplication testing only), FH, FLCN, GATA2, GPC3, GREM1 (Promoter region deletion/duplication testing only), HOXB13 (c.251G>A, p.Gly84Glu), HRAS, KIT, MAX, MEN1, MET, MITF (c.952G>A, p.Glu318Lys variant only), MLH1, MSH2, MSH3, MSH6, MUTYH, NBN, NF1, NF2, NTHL1, PALB2, PDGFRA, PHOX2B, PMS2, POLD1, POLE, POT1, PRKAR1A, PTCH1, PTEN, RAD50, RAD51C, RAD51D, RB1, RECQL4, RET, RNF43, RUNX1, SDHAF2, SDHA (sequence changes only), SDHB, SDHC, SDHD, SMAD4, SMARCA4, SMARCB1, SMARCE1, STK11, SUFU, TERC, TERT, TMEM127, TP53, TSC1, TSC2, VHL, WRN and WT1.  The test report has been scanned into EPIC and is located under the Molecular Pathology section of the Results Review tab.  A portion of the result report is included below for  reference.     We discussed with [name] that because current genetic testing is not perfect, it is possible there may be a gene mutation in one of these genes that current testing cannot detect, but that chance is small.  We also discussed, that there could be another gene that has not yet been discovered, or that we have not yet tested,  that is responsible for the cancer diagnoses in the family. It is also possible there is a hereditary cause for the cancer in the family that [name] did not inherit and therefore was not identified in her testing.  Therefore, it is important to remain in touch with cancer genetics in the future so that we can continue to offer [name] the most up to date genetic testing.   ADDITIONAL GENETIC TESTING: We discussed with [name] that her genetic testing was fairly extensive.  If there are genes identified to increase cancer risk that can be analyzed in the future, we would be happy to discuss and coordinate this testing at that time.    CANCER SCREENING RECOMMENDATIONS: [name] test result is considered negative (normal).  This means that we have not identified a hereditary cause for her personal history of polyps and family history of cancer at this time.   While reassuring, this does not definitively rule out a hereditary predisposition to cancer. It is still possible that there could be genetic mutations that are undetectable by current technology. There could be genetic mutations in genes that have not been tested or identified to increase cancer risk.  Therefore, it is recommended she continue to follow the cancer management and screening guidelines provided by her  primary healthcare provider.   An individual's cancer risk and medical management are not determined by genetic test results alone. Overall cancer risk assessment incorporates additional factors, including personal medical history, family history, and any available genetic information that may result in a personalized plan for cancer prevention and surveillance.  RECOMMENDATIONS FOR FAMILY MEMBERS:  Relatives in this family might be at some increased risk of developing cancer, over the general population risk, simply due to the family history of cancer.  We recommended female relatives in this family have a yearly mammogram  beginning at age 39, or 69 years younger than the earliest onset of cancer, an annual clinical breast exam, and perform monthly breast self-exams. Female relatives in this family should also have a gynecological exam as recommended by their primary provider. All family members should have a colonoscopy by age 43, or as directed by their physicians.   It is also possible there is a hereditary cause for the cancer in Ms. McGee's family that she did not inherit and therefore was not identified in her.  Based on Ms. McGee's family history, we recommended her maternal relatives have genetic counseling and testing. [name] will let us know if we can be of any assistance in coordinating genetic counseling and/or testing for these family members.  FOLLOW-UP: Lastly, we discussed with [name] that cancer genetics is a rapidly advancing field and it is possible that new genetic tests will be appropriate for her and/or her family members in the future. We encouraged her to remain in contact with cancer genetics on an annual basis so we can update her personal and family histories and let her know of advances in cancer genetics that may benefit this family.   Our contact number was provided. [name] questions were answered to her satisfaction,  and she knows she is welcome to call us at anytime with additional questions or concerns.   Faith Rogue, MS, [facility] Genetic Counselor [email or web address] Phone: [phone]

## 2019-03-09 ENCOUNTER — Ambulatory Visit (INDEPENDENT_AMBULATORY_CARE_PROVIDER_SITE_OTHER): Payer: BC Managed Care – PPO | Admitting: Internal Medicine

## 2019-03-09 ENCOUNTER — Encounter: Payer: Self-pay | Admitting: Internal Medicine

## 2019-03-09 ENCOUNTER — Other Ambulatory Visit: Payer: Self-pay

## 2019-03-09 VITALS — BP 116/62 | HR 63 | Temp 98.2°F | Ht 67.0 in | Wt 135.1 lb

## 2019-03-09 DIAGNOSIS — K635 Polyp of colon: Secondary | ICD-10-CM | POA: Diagnosis not present

## 2019-03-09 DIAGNOSIS — K5909 Other constipation: Secondary | ICD-10-CM | POA: Diagnosis not present

## 2019-03-09 NOTE — Patient Instructions (Signed)
Discontinue Amitiza.  We have given you a 2 week trial of Trulance 3 mg tablets to try. Call our office in 10 days to let us know if it is helping. If so, we can send you a prescription.  We will refer you to Dr Harold Hedge for allergy testing. If you do not hear about this referral in the next 1-2 weeks, please call our office at [phone].  If you are age 21 or older, your body mass index should be between 23-30. Your Body mass index is 21.16 kg/m. If this is out of the aforementioned range listed, please consider follow up with your Primary Care Provider.  If you are age 70 or younger, your body mass index should be between 19-25. Your Body mass index is 21.16 kg/m. If this is out of the aformentioned range listed, please consider follow up with your Primary Care Provider.

## 2019-03-10 NOTE — Progress Notes (Signed)
   Subjective:    Patient ID: Teresa Garrett, female    DOB: August 27, 1995, 23 y.o.   MRN: [id number]  HPI Teresa Garrett is a 23 year old female with a history of constipation, sessile serrated colon polyp, who is seen for follow-up.  She is here alone today.  She had a colonoscopy which I performed to evaluate for constipation and change in bowel habit on 01/04/2019.  Patient revealed a normal terminal ileum.  A 6 mm polyp in the ascending colon with a mucous cap.  This was a sessile serrated polyp without dysplasia.  A 4 mm polyp from the proximal descending colon was benign and not adenomatous nor sessile serrated.  Exam was otherwise normal.  After her sessile serrated polyp was found at age 84 I referred her for genetic testing to exclude polyp syndrome.  She met with medical genetics and the genetic blood test was negative for any known mutation.    She tried Linzess without result and has been taking Amitiza 4 mcg twice daily.  She went for 12 days after colonoscopy without having a single bowel movement.  Amitiza is also not working well for her.  She will go 2 to 3 days without bowel movement and then have nearly complete liquid stool.  She reports it just builds up and then comes out is all liquid.  She has lower abdominal cramping associated with these bowel movements.  No blood in her stool.  She is running and remaining quite active.  She also previously tried MiraLAX without result.  She wonders if she could have a food allergy.   Review of Systems As per HPI, otherwise negative  Current Medications, Allergies, Past Medical History, Past Surgical History, Family History and Social History were reviewed in [employer] record.     Objective:   Physical Exam BP 116/62 (BP Location: Left Arm, Patient Position: Sitting, Cuff Size: Normal)   Pulse 63   Temp 98.2 F (36.8 C) (Oral)   Ht '5\' 7"'$  (1.702 m)   Wt 135 lb 2 oz (61.3 kg)   BMI 21.16 kg/m  Gen: awake,  alert, NAD HEENT: anicteric, op clear CV: RRR, no mrg Pulm: CTA b/l Abd: soft, mild tenderness in the mid lower abdomen without rebound or guarding, nondistended, +BS throughout Ext: no c/c/e Neuro: nonfocal  TTG negative IgA normal TSH normal      Assessment & Plan:  23 year old female with a history of constipation, sessile serrated colon polyp, who is seen for follow-up.  1.  Chronic constipation --unclear etiology for her symptoms.  Structurally her colon was normal.  She has failed MiraLAX, Linzess and now Amitiza.  We will try Trulance. --Discontinue Amitiza --Trial of Trulance 3 mg daily.  Samples provided x14 days --She should call if not responding. --Not sure if this is food allergy related but she would like this further evaluated.  This is reasonable.  I will send her to see Dr. Harold Hedge  2.  History of sessile serrated polyp --occurring at age 60.  Fortunately medical genetic evaluation was negative.  I recommend we consider repeat her colonoscopy in 3 to 5 years given her sessile serrated polyp and young age.  25 minutes spent with the patient today. Greater than 50% was spent in counseling and coordination of care with the patient

## 2019-03-12 ENCOUNTER — Telehealth: Payer: Self-pay | Admitting: *Deleted

## 2019-03-12 NOTE — Telephone Encounter (Signed)
16 pages of office notes/demographics have been faxed to Dr Alyson Reedy office at fax [phone]. Per Dr Lucianne Lei [facility] office, they will contact the patient once records have been reviewed.

## 2019-03-16 NOTE — Telephone Encounter (Signed)
Patient has been scheduled to see Dr Harold Hedge on 03/23/19 at 145pm. Patient is aware of appointment per Dr Alyson Reedy office.

## 2019-03-23 DIAGNOSIS — H1045 Other chronic allergic conjunctivitis: Secondary | ICD-10-CM | POA: Diagnosis not present

## 2019-03-23 DIAGNOSIS — J3089 Other allergic rhinitis: Secondary | ICD-10-CM | POA: Diagnosis not present

## 2019-03-23 DIAGNOSIS — T781XXD Other adverse food reactions, not elsewhere classified, subsequent encounter: Secondary | ICD-10-CM | POA: Diagnosis not present

## 2019-03-31 DIAGNOSIS — F410 Panic disorder [episodic paroxysmal anxiety] without agoraphobia: Secondary | ICD-10-CM | POA: Diagnosis not present

## 2019-06-28 DIAGNOSIS — U071 COVID-19: Secondary | ICD-10-CM | POA: Diagnosis not present

## 2019-06-28 DIAGNOSIS — Z1159 Encounter for screening for other viral diseases: Secondary | ICD-10-CM | POA: Diagnosis not present

## 2019-06-29 DIAGNOSIS — F410 Panic disorder [episodic paroxysmal anxiety] without agoraphobia: Secondary | ICD-10-CM | POA: Diagnosis not present

## 2019-08-04 DIAGNOSIS — M531 Cervicobrachial syndrome: Secondary | ICD-10-CM | POA: Diagnosis not present

## 2019-08-04 DIAGNOSIS — M9903 Segmental and somatic dysfunction of lumbar region: Secondary | ICD-10-CM | POA: Diagnosis not present

## 2019-08-04 DIAGNOSIS — M5386 Other specified dorsopathies, lumbar region: Secondary | ICD-10-CM | POA: Diagnosis not present

## 2019-08-04 DIAGNOSIS — M9905 Segmental and somatic dysfunction of pelvic region: Secondary | ICD-10-CM | POA: Diagnosis not present

## 2019-08-06 DIAGNOSIS — M5386 Other specified dorsopathies, lumbar region: Secondary | ICD-10-CM | POA: Diagnosis not present

## 2019-08-06 DIAGNOSIS — M9903 Segmental and somatic dysfunction of lumbar region: Secondary | ICD-10-CM | POA: Diagnosis not present

## 2019-08-06 DIAGNOSIS — M9905 Segmental and somatic dysfunction of pelvic region: Secondary | ICD-10-CM | POA: Diagnosis not present

## 2019-08-06 DIAGNOSIS — M531 Cervicobrachial syndrome: Secondary | ICD-10-CM | POA: Diagnosis not present

## 2019-08-10 DIAGNOSIS — M5386 Other specified dorsopathies, lumbar region: Secondary | ICD-10-CM | POA: Diagnosis not present

## 2019-08-10 DIAGNOSIS — M9903 Segmental and somatic dysfunction of lumbar region: Secondary | ICD-10-CM | POA: Diagnosis not present

## 2019-08-10 DIAGNOSIS — M531 Cervicobrachial syndrome: Secondary | ICD-10-CM | POA: Diagnosis not present

## 2019-08-10 DIAGNOSIS — M9905 Segmental and somatic dysfunction of pelvic region: Secondary | ICD-10-CM | POA: Diagnosis not present

## 2019-08-13 DIAGNOSIS — M9903 Segmental and somatic dysfunction of lumbar region: Secondary | ICD-10-CM | POA: Diagnosis not present

## 2019-08-13 DIAGNOSIS — M9905 Segmental and somatic dysfunction of pelvic region: Secondary | ICD-10-CM | POA: Diagnosis not present

## 2019-08-13 DIAGNOSIS — M5386 Other specified dorsopathies, lumbar region: Secondary | ICD-10-CM | POA: Diagnosis not present

## 2019-08-13 DIAGNOSIS — M531 Cervicobrachial syndrome: Secondary | ICD-10-CM | POA: Diagnosis not present

## 2019-08-17 DIAGNOSIS — M9903 Segmental and somatic dysfunction of lumbar region: Secondary | ICD-10-CM | POA: Diagnosis not present

## 2019-08-17 DIAGNOSIS — M531 Cervicobrachial syndrome: Secondary | ICD-10-CM | POA: Diagnosis not present

## 2019-08-17 DIAGNOSIS — M9905 Segmental and somatic dysfunction of pelvic region: Secondary | ICD-10-CM | POA: Diagnosis not present

## 2019-08-17 DIAGNOSIS — M5386 Other specified dorsopathies, lumbar region: Secondary | ICD-10-CM | POA: Diagnosis not present

## 2019-08-20 DIAGNOSIS — M531 Cervicobrachial syndrome: Secondary | ICD-10-CM | POA: Diagnosis not present

## 2019-08-20 DIAGNOSIS — M5386 Other specified dorsopathies, lumbar region: Secondary | ICD-10-CM | POA: Diagnosis not present

## 2019-08-20 DIAGNOSIS — M9903 Segmental and somatic dysfunction of lumbar region: Secondary | ICD-10-CM | POA: Diagnosis not present

## 2019-08-20 DIAGNOSIS — M9905 Segmental and somatic dysfunction of pelvic region: Secondary | ICD-10-CM | POA: Diagnosis not present

## 2019-08-24 DIAGNOSIS — M9905 Segmental and somatic dysfunction of pelvic region: Secondary | ICD-10-CM | POA: Diagnosis not present

## 2019-08-24 DIAGNOSIS — M9903 Segmental and somatic dysfunction of lumbar region: Secondary | ICD-10-CM | POA: Diagnosis not present

## 2019-08-24 DIAGNOSIS — M5386 Other specified dorsopathies, lumbar region: Secondary | ICD-10-CM | POA: Diagnosis not present

## 2019-08-24 DIAGNOSIS — M531 Cervicobrachial syndrome: Secondary | ICD-10-CM | POA: Diagnosis not present

## 2019-09-03 DIAGNOSIS — M5386 Other specified dorsopathies, lumbar region: Secondary | ICD-10-CM | POA: Diagnosis not present

## 2019-09-03 DIAGNOSIS — M531 Cervicobrachial syndrome: Secondary | ICD-10-CM | POA: Diagnosis not present

## 2019-09-03 DIAGNOSIS — M9905 Segmental and somatic dysfunction of pelvic region: Secondary | ICD-10-CM | POA: Diagnosis not present

## 2019-09-03 DIAGNOSIS — M9903 Segmental and somatic dysfunction of lumbar region: Secondary | ICD-10-CM | POA: Diagnosis not present

## 2019-09-16 DIAGNOSIS — M531 Cervicobrachial syndrome: Secondary | ICD-10-CM | POA: Diagnosis not present

## 2019-09-16 DIAGNOSIS — M9905 Segmental and somatic dysfunction of pelvic region: Secondary | ICD-10-CM | POA: Diagnosis not present

## 2019-09-16 DIAGNOSIS — M9903 Segmental and somatic dysfunction of lumbar region: Secondary | ICD-10-CM | POA: Diagnosis not present

## 2019-09-16 DIAGNOSIS — M5386 Other specified dorsopathies, lumbar region: Secondary | ICD-10-CM | POA: Diagnosis not present

## 2019-09-23 DIAGNOSIS — F410 Panic disorder [episodic paroxysmal anxiety] without agoraphobia: Secondary | ICD-10-CM | POA: Diagnosis not present

## 2019-09-24 DIAGNOSIS — M5386 Other specified dorsopathies, lumbar region: Secondary | ICD-10-CM | POA: Diagnosis not present

## 2019-09-24 DIAGNOSIS — M9905 Segmental and somatic dysfunction of pelvic region: Secondary | ICD-10-CM | POA: Diagnosis not present

## 2019-09-24 DIAGNOSIS — M531 Cervicobrachial syndrome: Secondary | ICD-10-CM | POA: Diagnosis not present

## 2019-09-24 DIAGNOSIS — M9903 Segmental and somatic dysfunction of lumbar region: Secondary | ICD-10-CM | POA: Diagnosis not present

## 2019-10-22 DIAGNOSIS — M531 Cervicobrachial syndrome: Secondary | ICD-10-CM | POA: Diagnosis not present

## 2019-10-22 DIAGNOSIS — M5386 Other specified dorsopathies, lumbar region: Secondary | ICD-10-CM | POA: Diagnosis not present

## 2019-10-22 DIAGNOSIS — M9903 Segmental and somatic dysfunction of lumbar region: Secondary | ICD-10-CM | POA: Diagnosis not present

## 2019-10-22 DIAGNOSIS — M9905 Segmental and somatic dysfunction of pelvic region: Secondary | ICD-10-CM | POA: Diagnosis not present

## 2019-10-25 DIAGNOSIS — M531 Cervicobrachial syndrome: Secondary | ICD-10-CM | POA: Diagnosis not present

## 2019-10-25 DIAGNOSIS — M9903 Segmental and somatic dysfunction of lumbar region: Secondary | ICD-10-CM | POA: Diagnosis not present

## 2019-10-25 DIAGNOSIS — M9905 Segmental and somatic dysfunction of pelvic region: Secondary | ICD-10-CM | POA: Diagnosis not present

## 2019-10-25 DIAGNOSIS — M5386 Other specified dorsopathies, lumbar region: Secondary | ICD-10-CM | POA: Diagnosis not present

## 2019-10-29 DIAGNOSIS — M5386 Other specified dorsopathies, lumbar region: Secondary | ICD-10-CM | POA: Diagnosis not present

## 2019-10-29 DIAGNOSIS — M9905 Segmental and somatic dysfunction of pelvic region: Secondary | ICD-10-CM | POA: Diagnosis not present

## 2019-10-29 DIAGNOSIS — M531 Cervicobrachial syndrome: Secondary | ICD-10-CM | POA: Diagnosis not present

## 2019-10-29 DIAGNOSIS — M9903 Segmental and somatic dysfunction of lumbar region: Secondary | ICD-10-CM | POA: Diagnosis not present

## 2019-11-05 DIAGNOSIS — M9903 Segmental and somatic dysfunction of lumbar region: Secondary | ICD-10-CM | POA: Diagnosis not present

## 2019-11-05 DIAGNOSIS — M9905 Segmental and somatic dysfunction of pelvic region: Secondary | ICD-10-CM | POA: Diagnosis not present

## 2019-11-05 DIAGNOSIS — M5386 Other specified dorsopathies, lumbar region: Secondary | ICD-10-CM | POA: Diagnosis not present

## 2019-11-05 DIAGNOSIS — M531 Cervicobrachial syndrome: Secondary | ICD-10-CM | POA: Diagnosis not present

## 2019-11-12 DIAGNOSIS — M531 Cervicobrachial syndrome: Secondary | ICD-10-CM | POA: Diagnosis not present

## 2019-11-12 DIAGNOSIS — M9903 Segmental and somatic dysfunction of lumbar region: Secondary | ICD-10-CM | POA: Diagnosis not present

## 2019-11-12 DIAGNOSIS — M5386 Other specified dorsopathies, lumbar region: Secondary | ICD-10-CM | POA: Diagnosis not present

## 2019-11-12 DIAGNOSIS — M9905 Segmental and somatic dysfunction of pelvic region: Secondary | ICD-10-CM | POA: Diagnosis not present

## 2019-11-23 DIAGNOSIS — M79662 Pain in left lower leg: Secondary | ICD-10-CM | POA: Diagnosis not present

## 2019-11-23 DIAGNOSIS — M25552 Pain in left hip: Secondary | ICD-10-CM | POA: Diagnosis not present

## 2019-11-24 DIAGNOSIS — M9905 Segmental and somatic dysfunction of pelvic region: Secondary | ICD-10-CM | POA: Diagnosis not present

## 2019-11-24 DIAGNOSIS — M5386 Other specified dorsopathies, lumbar region: Secondary | ICD-10-CM | POA: Diagnosis not present

## 2019-11-24 DIAGNOSIS — M9903 Segmental and somatic dysfunction of lumbar region: Secondary | ICD-10-CM | POA: Diagnosis not present

## 2019-11-24 DIAGNOSIS — M531 Cervicobrachial syndrome: Secondary | ICD-10-CM | POA: Diagnosis not present

## 2019-12-13 DIAGNOSIS — M5386 Other specified dorsopathies, lumbar region: Secondary | ICD-10-CM | POA: Diagnosis not present

## 2019-12-13 DIAGNOSIS — M9905 Segmental and somatic dysfunction of pelvic region: Secondary | ICD-10-CM | POA: Diagnosis not present

## 2019-12-13 DIAGNOSIS — M9903 Segmental and somatic dysfunction of lumbar region: Secondary | ICD-10-CM | POA: Diagnosis not present

## 2019-12-13 DIAGNOSIS — M531 Cervicobrachial syndrome: Secondary | ICD-10-CM | POA: Diagnosis not present

## 2020-01-06 DIAGNOSIS — M25572 Pain in left ankle and joints of left foot: Secondary | ICD-10-CM | POA: Diagnosis not present

## 2020-01-06 DIAGNOSIS — M25552 Pain in left hip: Secondary | ICD-10-CM | POA: Diagnosis not present

## 2020-02-23 DIAGNOSIS — Z3201 Encounter for pregnancy test, result positive: Secondary | ICD-10-CM | POA: Diagnosis not present

## 2020-02-23 DIAGNOSIS — Z113 Encounter for screening for infections with a predominantly sexual mode of transmission: Secondary | ICD-10-CM | POA: Diagnosis not present

## 2020-02-24 DIAGNOSIS — Z124 Encounter for screening for malignant neoplasm of cervix: Secondary | ICD-10-CM | POA: Diagnosis not present

## 2020-04-10 ENCOUNTER — Emergency Department (HOSPITAL_COMMUNITY)
Admission: EM | Admit: 2020-04-10 | Discharge: 2020-04-10 | Disposition: A | Discharge status: Home / Self Care | Payer: BC Managed Care – PPO | Attending: Emergency Medicine | Admitting: Emergency Medicine

## 2020-04-10 ENCOUNTER — Emergency Department (HOSPITAL_COMMUNITY): Payer: BC Managed Care – PPO

## 2020-04-10 ENCOUNTER — Other Ambulatory Visit: Payer: Self-pay

## 2020-04-10 ENCOUNTER — Encounter (HOSPITAL_COMMUNITY): Payer: Self-pay | Admitting: Obstetrics and Gynecology

## 2020-04-10 DIAGNOSIS — O26892 Other specified pregnancy related conditions, second trimester: Secondary | ICD-10-CM | POA: Diagnosis not present

## 2020-04-10 DIAGNOSIS — J45909 Unspecified asthma, uncomplicated: Secondary | ICD-10-CM | POA: Insufficient documentation

## 2020-04-10 DIAGNOSIS — R1011 Right upper quadrant pain: Secondary | ICD-10-CM

## 2020-04-10 DIAGNOSIS — N1330 Unspecified hydronephrosis: Secondary | ICD-10-CM | POA: Diagnosis not present

## 2020-04-10 DIAGNOSIS — Z3A19 19 weeks gestation of pregnancy: Secondary | ICD-10-CM | POA: Insufficient documentation

## 2020-04-10 DIAGNOSIS — O26833 Pregnancy related renal disease, third trimester: Secondary | ICD-10-CM | POA: Insufficient documentation

## 2020-04-10 DIAGNOSIS — R9431 Abnormal electrocardiogram [ECG] [EKG]: Secondary | ICD-10-CM | POA: Diagnosis not present

## 2020-04-10 DIAGNOSIS — K7689 Other specified diseases of liver: Secondary | ICD-10-CM | POA: Diagnosis not present

## 2020-04-10 DIAGNOSIS — R55 Syncope and collapse: Secondary | ICD-10-CM | POA: Diagnosis not present

## 2020-04-10 LAB — COMPREHENSIVE METABOLIC PANEL
ALT: 15 U/L (ref 0–44)
AST: 22 U/L (ref 15–41)
Albumin: 3.6 g/dL (ref 3.5–5.0)
Alkaline Phosphatase: 52 U/L (ref 38–126)
Anion gap: 9 (ref 5–15)
BUN: 9 mg/dL (ref 6–20)
CO2: 24 mmol/L (ref 22–32)
Calcium: 9.5 mg/dL (ref 8.9–10.3)
Chloride: 104 mmol/L (ref 98–111)
Creatinine, Ser: 0.71 mg/dL (ref 0.44–1.00)
GFR, Estimated: >60 mL/min (ref >60)
Glucose, Bld: 73 mg/dL (ref 70–99)
Potassium: 3.9 mmol/L (ref 3.5–5.1)
Sodium: 137 mmol/L (ref 135–145)
Total Bilirubin: 0.4 mg/dL (ref 0.3–1.2)
Total Protein: 7 g/dL (ref 6.5–8.1)

## 2020-04-10 LAB — CBC
HCT: 38.5 % (ref 36.0–46.0)
Hemoglobin: 13.1 g/dL (ref 12.0–15.0)
MCH: 30.9 pg (ref 26.0–34.0)
MCHC: 34 g/dL (ref 30.0–36.0)
MCV: 90.8 fL (ref 80.0–100.0)
Platelets: 204 10*3/uL (ref 150–400)
RBC: 4.24 MIL/uL (ref 3.87–5.11)
RDW: 12.7 % (ref 11.5–15.5)
WBC: 8.8 10*3/uL (ref 4.0–10.5)
nRBC: 0 % (ref 0.0–0.2)

## 2020-04-10 LAB — URINALYSIS, ROUTINE W REFLEX MICROSCOPIC
Bilirubin Urine: NEGATIVE
Glucose, UA: NEGATIVE mg/dL
Hgb urine dipstick: NEGATIVE
Ketones, ur: NEGATIVE mg/dL
Leukocytes,Ua: NEGATIVE
Nitrite: NEGATIVE
Protein, ur: NEGATIVE mg/dL
Specific Gravity, Urine: 1.021 (ref 1.005–1.030)
pH: 5 (ref 5.0–8.0)

## 2020-04-10 LAB — CBG MONITORING, ED: Glucose-Capillary: 78 mg/dL (ref 70–99)

## 2020-04-10 LAB — HCG, QUANTITATIVE, PREGNANCY: hCG, Beta Chain, Quant, S: 28024 m[IU]/mL — ABNORMAL HIGH (ref <5)

## 2020-04-10 LAB — LIPASE, BLOOD: Lipase: 23 U/L (ref 11–51)

## 2020-04-10 MED ORDER — SODIUM CHLORIDE 0.9 % IV BOLUS
1000.0000 mL | Freq: Once | INTRAVENOUS | Status: AC
  Administered 2020-04-10: 1000 mL via INTRAVENOUS

## 2020-04-10 MED ORDER — ACETAMINOPHEN 325 MG PO TABS
650.0000 mg | ORAL_TABLET | Freq: Once | ORAL | Status: AC
  Administered 2020-04-10: 650 mg via ORAL
  Filled 2020-04-10: qty 2

## 2020-04-10 NOTE — ED Triage Notes (Signed)
Patient reports she is [redacted] weeks pregnant and had a syncopal event. Patient states she is concerned about the baby. Patient states she is followed by [facility]. Patient denies vaginal bleeding, but reports clear d/c. Patient reports abdominal pain x2 days.

## 2020-04-10 NOTE — Discharge Instructions (Signed)
Follow-up with the OB/GYN within the next few days  Return for any worsening symptoms  Take Tylenol as needed for pain.  [facility] does have a women's emergency department for pregnant patients they are staffed by OB/GYN's if you have any new or worsening symptoms such as increased abdominal pain, vaginal bleeding, fluid leakage I would suggest reevaluation.

## 2020-04-10 NOTE — ED Provider Notes (Signed)
Teresa Garrett DEPT Provider Note   CSN: [id number] Arrival date & time: 04/10/20  4270    History Chief Complaint  Patient presents with  . Loss of Consciousness    Teresa Garrett is a 24 y.o. female G1P0 with no significant past medical history who presents for evaluation of syncope.  Has history of intermittent syncope.  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  States she "felt it coming on."  Felt like her prior episodes of syncope.  No history of arrhythmia, HCOM, aneurysm, PE. No headache, lightheadedness, dizziness, chest pain, shortness of breath, hemoptysis, lateral leg swelling, redness, warmth. Witnessed fall.  Denies hitting head.  No sudden onset thunderclap headache.  Denies trauma to the abdomen.  Has been having generalized abdominal pain over the last 2 days.  No vaginal bleeding, discharge.  Has not taken anything for pain.  No fluid leakage. No prior abd surgeries. Has not felt baby move yet.   Initially seen at [redacted] weeks pregnant with [facility] pregnancy network.  Apparently had inconsistent ultrasound at that time.  Was seen on 03/28/2020 to establish care with [facility].  She was noticed seen by provider.  Has not had formal ultrasound performed.  Denies additional aggravating or alleviating factors.  History obtained from patient and past medical records.  No interpreter used.    HPI     Past Medical History:  Diagnosis Date  . Asthma    as a child  . Compartment syndrome of right lower extremity ([place])   . Complication of anesthesia   . Concussion   . Family history of breast cancer   . Headache    migraines-   . PONV (postoperative nausea and vomiting)     Patient Active Problem List   Diagnosis Date Noted  . Genetic testing 03/02/2019  . Personal history of colonic polyps 01/28/2019  . Family history of breast cancer   . Compartment syndrome of lower extremity due to exertion, right   .  Medial tibial stress syndrome, left, initial encounter 04/30/2016    Past Surgical History:  Procedure Laterality Date  . FASCIOTOMY Right 07/03/2018   Procedure: ANTERIOR AND LATERAL COMPARTMENT RELEASE RIGHT LEG;  Surgeon: Newt Minion, MD;  Location: [place];  Service: Orthopedics;  Laterality: Right;  . WISDOM TOOTH EXTRACTION     "gas"     OB History    Gravida  1   Para      Term      Preterm      AB      Living        SAB      TAB      Ectopic      Multiple      Live Births              Family History  Problem Relation Age of Onset  . Breast cancer Other        dx 66s  . Colon cancer Neg Hx   . Esophageal cancer Neg Hx   . Pancreatic cancer Neg Hx   . Stomach cancer Neg Hx   . Liver disease Neg Hx     Social History   Tobacco Use  . Smoking status: Never Smoker  . Smokeless tobacco: Never Used  Vaping Use  . Vaping Use: Never used  Substance Use Topics  . Alcohol use: No  . Drug use: No    Home Medications Prior  to Admission medications   Medication Sig Start Date End Date Taking? Authorizing Provider  Prenatal Vit-Fe Fumarate-FA (PRENATAL PO) Take 1 tablet by mouth daily.   Yes [provider]  lubiprostone (AMITIZA) 24 MCG capsule Take 1 capsule (24 mcg total) by mouth 2 (two) times daily with a meal. Patient not taking: Reported on 04/10/2020 02/04/19   Pyrtle, Lajuan Lines, MD    Allergies    Apple  Review of Systems   Review of Systems  Constitutional: Negative.   HENT: Negative.   Respiratory: Negative.   Cardiovascular: Negative.   Gastrointestinal: Positive for abdominal pain. Negative for abdominal distention, anal bleeding, blood in stool, constipation, diarrhea, nausea, rectal pain and vomiting.  Genitourinary: Negative.   Musculoskeletal: Negative.   Skin: Negative.   Neurological: Positive for syncope. Negative for dizziness, tremors, seizures, facial asymmetry, speech difficulty, weakness, light-headedness,  numbness and headaches.  All other systems reviewed and are negative.  Physical Exam Updated Vital Signs BP (!) 109/55   Pulse (!) 59   Temp 98 F (36.7 C) (Oral)   Resp 18   Ht 5\' 7"  (1.702 m)   Wt 63.5 kg   SpO2 98%   BMI 21.93 kg/m   Physical Exam Vitals and nursing note reviewed.  Constitutional:      General: She is not in acute distress.    Appearance: She is well-developed. She is not ill-appearing, toxic-appearing or diaphoretic.  HENT:     Head: Normocephalic and atraumatic.     Nose: Nose normal.     Mouth/Throat:     Mouth: Mucous membranes are moist.  Eyes:     Extraocular Movements: Extraocular movements intact.     Conjunctiva/sclera: Conjunctivae normal.     Pupils: Pupils are equal, round, and reactive to light.     Comments: No horizontal, vertical or rotational nystagmus    Neck:     Trachea: Trachea and phonation normal.     Comments: Full active and passive ROM without pain No midline or paraspinal tenderness No nuchal rigidity or meningeal signs  Cardiovascular:     Rate and Rhythm: Normal rate.     Pulses: Normal pulses.     Heart sounds: Normal heart sounds.  Pulmonary:     Effort: Pulmonary effort is normal. No respiratory distress.     Breath sounds: Normal breath sounds.  Abdominal:     General: Bowel sounds are normal. There is no distension.     Palpations: Abdomen is soft. There is no mass.     Tenderness: There is abdominal tenderness. There is no right CVA tenderness, left CVA tenderness, guarding or rebound.     Hernia: No hernia is present.     Comments: Gravid abdomen 1 finger below umbilicus. Mild tenderness to RUQ and right flank  Musculoskeletal:        General: No swelling, tenderness, deformity or signs of injury. Normal range of motion.     Cervical back: Full passive range of motion without pain and normal range of motion.     Right lower leg: No edema.     Left lower leg: No edema.     Comments: Compartments soft. Moves  all 4 extremities without difficulty. Homans sign negative  Skin:    General: Skin is warm and dry.     Capillary Refill: Capillary refill takes less than 2 seconds.  Neurological:     General: No focal deficit present.     Mental Status: She is alert and oriented  to person, place, and time.     Comments: Mental Status:  Alert, oriented, thought content appropriate. Speech fluent without evidence of aphasia. Able to follow 2 step commands without difficulty.  Cranial Nerves:  II:  Peripheral visual fields grossly normal, pupils equal, round, reactive to light III,IV, VI: ptosis not present, extra-ocular motions intact bilaterally  V,VII: smile symmetric, facial light touch sensation equal VIII: hearing grossly normal bilaterally  IX,X: midline uvula rise  XI: bilateral shoulder shrug equal and strong XII: midline tongue extension  Motor:  5/5 in upper and lower extremities bilaterally including strong and equal grip strength and dorsiflexion/plantar flexion Sensory: Pinprick and light touch normal in all extremities.  Deep Tendon Reflexes: 2+ and symmetric  Cerebellar: normal finger-to-nose with bilateral upper extremities Gait: normal gait and balance CV: distal pulses palpable throughout       ED Results / Procedures / Treatments   Labs (all labs ordered are listed, but only abnormal results are displayed) Labs Reviewed  URINALYSIS, ROUTINE W REFLEX MICROSCOPIC - Abnormal; Notable for the following components:      Result Value   APPearance HAZY (*)    All other components within normal limits  HCG, QUANTITATIVE, PREGNANCY - Abnormal; Notable for the following components:   hCG, Beta Chain, Quant, S 28,024 (*)    All other components within normal limits  CBC  COMPREHENSIVE METABOLIC PANEL  LIPASE, BLOOD  CBG MONITORING, ED    EKG EKG Interpretation  Date/Time:  Monday April 10 2020 08:26:44 EDT Ventricular Rate:  69 PR Interval:    QRS Duration: 86 QT  Interval:  370 QTC Calculation: 397 R Axis:   95 Text Interpretation: Sinus rhythm Borderline right axis deviation 12 Lead; Mason[place]Likar No previous ECGs available Confirmed by Wandra Arthurs [phone]) on 04/10/2020 12:25:57 PM   Radiology US OB Limited > 14 wks  Result Date: 04/10/2020 CLINICAL DATA:  Pregnancy.  Abdominal pain. EXAM: LIMITED OBSTETRIC ULTRASOUND FINDINGS: Number of Fetuses: 1 Heart Rate:  141 bpm Movement: Yes Presentation: Breech Placental Location: Anterior Previa: No Amniotic Fluid (Subjective):  Within normal limits. BPD: 4.4 cm 19 w  2 d MATERNAL FINDINGS: Cervix:  Appears closed. Uterus/Adnexae: Mildly complex left adnexal cyst measuring up to 5.2 cm. IMPRESSION: 1. Single live intrauterine gestation measuring 19 weeks 2 days by BPD. 2. Active fetal heart tones at 141 bpm. 3. Incidental note of a mildly complex left adnexal cyst measuring up to 5.2 cm. Follow-up ultrasound in 6-12 weeks is recommended to ensure resolution. This exam is performed on an emergent basis and does not comprehensively evaluate fetal size, dating, or anatomy; follow-up complete OB US should be considered if further fetal assessment is warranted. Electronically Signed   By: Davina Poke D.O.   On: 04/10/2020 12:15   US Abdomen Limited RUQ (LIVER/GB)  Result Date: 04/10/2020 CLINICAL DATA:  Abdominal pain.  Pregnant EXAM: ULTRASOUND ABDOMEN LIMITED RIGHT UPPER QUADRANT COMPARISON:  None. FINDINGS: Gallbladder: No gallstones or wall thickening visualized. No sonographic Murphy sign noted by sonographer. Common bile duct: Diameter: 3 mm Liver: Mild increased echogenicity liver diffusely without focal liver lesion. Portal vein is patent on color Doppler imaging with normal direction of blood flow towards the liver. Other: Moderate to severe right hydronephrosis which persists postvoid. Mild left hydronephrosis. IMPRESSION: Negative for gallstones Mild increased echogenicity liver suggesting fatty  infiltration Moderate to severe right hydronephrosis which persists post voiding. This may be related to distal obstruction or gravid uterus. Electronically Signed  By: Franchot Gallo M.D.   On: 04/10/2020 12:18   Procedures Procedures (including critical care time)  Medications Ordered in ED Medications  sodium chloride 0.9 % bolus 1,000 mL (0 mLs Intravenous Stopped 04/10/20 1257)  acetaminophen (TYLENOL) tablet 650 mg (650 mg Oral Given 04/10/20 1256)   ED Course  I have reviewed the triage vital signs and the nursing notes.  Pertinent labs & imaging results that were available during my care of the patient were reviewed by me and considered in my medical decision making (see chart for details).  G1P0 18 weeks by dates presents for evaluation of syncope/. Hx of syncope. No preceding headache, paresthesias, chest pain, shortness of breath.  Has had syncopal episodes before.  States this feels similar.  Had been standing for long period of time at work.  Denies hitting her head, losing anticoagulation.  Patient with nonfocal neuro exam without deficits.  Heart and lungs clear.  Compartments soft.  Neuromusculoskeletal exam.  No unilateral leg swelling, redness or warmth.  No clinical evidence of DVT on exam.  Low suspicion for PE as cause of her syncope.  Patient has been having right upper abdominal pain and right flank pain.  No urinary symptoms.  Urinating without difficulty.  Not worse with food intake.  Plan on labs, imaging and reassess.  She has not had formal ultrasound to establish IUP.  CBC without leukocytosis metabolic panel with electrolyte, renal abnormality UA negative for infection Lipase 23 Hcg Quant 28,000 [place] with IUP at 19w 3 days [place] RUQ with hydronephrosis however no obvious stones.  Could be due to pregnancy. Positive Orthostatic VS. UA negative for infection   Patient reassessed. Given IVF after positive Orthostatic VS. Tolerating PO intake. Recheck with Normal VS.    Patient without arrhythmia or tachycardia while here in the department.  Patient without history of congestive heart failure, normal hematocrit, normal ECG, no shortness of breath and systolic blood pressure greater than 90; patient is low risk. Will plan for discharge home with close cardiology/PCP follow-up.  Possibility of recurrent syncope has been discussed. I discussed reasons to avoid driving until cardiology/PCP followup and other safety prevention including use of ladders and working at heights.   Syncope likely orthostatic in nature.  Discussed push fluids, rest at home.  Discussed strict follow-up with OB/GYN.  The patient has been appropriately medically screened and/or stabilized in the ED. I have low suspicion for any other emergent medical condition which would require further screening, evaluation or treatment in the ED or require inpatient management.  Patient is hemodynamically stable and in no acute distress.  Patient able to ambulate in department prior to ED.  Evaluation does not show acute pathology that would require ongoing or additional emergent interventions while in the emergency department or further inpatient treatment.  I have discussed the diagnosis with the patient and answered all questions.  Pain is been managed while in the emergency department and patient has no further complaints prior to discharge.  Patient is comfortable with plan discussed in room and is stable for discharge at this time.  I have discussed strict return precautions for returning to the emergency department.  Patient was encouraged to follow-up with PCP/specialist refer to at discharge.  Patient discussed with attending, Dr. Darl Householder who agrees with treatment, plan and disposition.      MDM Rules/Calculators/A&P  Final Clinical Impression(s) / ED Diagnoses Final diagnoses:  RUQ pain  Pregnancy with 19 completed weeks gestation  Hydronephrosis, unspecified  hydronephrosis type    Rx / DC Orders ED Discharge Orders    None       Lakendrick Paradis A, PA-C 04/10/20 1357    Drenda Freeze, MD 04/11/20 [phone]

## 2020-04-10 NOTE — ED Notes (Signed)
Patients husband in room

## 2020-04-13 DIAGNOSIS — M9903 Segmental and somatic dysfunction of lumbar region: Secondary | ICD-10-CM | POA: Diagnosis not present

## 2020-04-13 DIAGNOSIS — M9902 Segmental and somatic dysfunction of thoracic region: Secondary | ICD-10-CM | POA: Diagnosis not present

## 2020-04-13 DIAGNOSIS — M9905 Segmental and somatic dysfunction of pelvic region: Secondary | ICD-10-CM | POA: Diagnosis not present

## 2020-04-13 DIAGNOSIS — M5386 Other specified dorsopathies, lumbar region: Secondary | ICD-10-CM | POA: Diagnosis not present

## 2020-04-24 DIAGNOSIS — Z3689 Encounter for other specified antenatal screening: Secondary | ICD-10-CM | POA: Diagnosis not present

## 2020-04-27 DIAGNOSIS — M5386 Other specified dorsopathies, lumbar region: Secondary | ICD-10-CM | POA: Diagnosis not present

## 2020-04-27 DIAGNOSIS — M9905 Segmental and somatic dysfunction of pelvic region: Secondary | ICD-10-CM | POA: Diagnosis not present

## 2020-04-27 DIAGNOSIS — M9903 Segmental and somatic dysfunction of lumbar region: Secondary | ICD-10-CM | POA: Diagnosis not present

## 2020-04-27 DIAGNOSIS — M9902 Segmental and somatic dysfunction of thoracic region: Secondary | ICD-10-CM | POA: Diagnosis not present

## 2020-05-16 DIAGNOSIS — M9905 Segmental and somatic dysfunction of pelvic region: Secondary | ICD-10-CM | POA: Diagnosis not present

## 2020-05-16 DIAGNOSIS — M9903 Segmental and somatic dysfunction of lumbar region: Secondary | ICD-10-CM | POA: Diagnosis not present

## 2020-05-16 DIAGNOSIS — M5386 Other specified dorsopathies, lumbar region: Secondary | ICD-10-CM | POA: Diagnosis not present

## 2020-05-16 DIAGNOSIS — M9902 Segmental and somatic dysfunction of thoracic region: Secondary | ICD-10-CM | POA: Diagnosis not present

## 2020-06-03 DIAGNOSIS — O99891 Other specified diseases and conditions complicating pregnancy: Secondary | ICD-10-CM | POA: Diagnosis not present

## 2020-06-03 DIAGNOSIS — N898 Other specified noninflammatory disorders of vagina: Secondary | ICD-10-CM | POA: Diagnosis not present

## 2020-06-03 DIAGNOSIS — Z3A26 26 weeks gestation of pregnancy: Secondary | ICD-10-CM | POA: Diagnosis not present

## 2020-06-03 DIAGNOSIS — O0932 Supervision of pregnancy with insufficient antenatal care, second trimester: Secondary | ICD-10-CM | POA: Diagnosis not present

## 2020-06-03 DIAGNOSIS — O26892 Other specified pregnancy related conditions, second trimester: Secondary | ICD-10-CM | POA: Diagnosis not present

## 2020-06-03 DIAGNOSIS — R102 Pelvic and perineal pain: Secondary | ICD-10-CM | POA: Diagnosis not present

## 2020-06-04 DIAGNOSIS — F32A Depression, unspecified: Secondary | ICD-10-CM | POA: Insufficient documentation

## 2020-06-04 DIAGNOSIS — F419 Anxiety disorder, unspecified: Secondary | ICD-10-CM | POA: Insufficient documentation

## 2020-06-06 DIAGNOSIS — M5386 Other specified dorsopathies, lumbar region: Secondary | ICD-10-CM | POA: Diagnosis not present

## 2020-06-06 DIAGNOSIS — M9903 Segmental and somatic dysfunction of lumbar region: Secondary | ICD-10-CM | POA: Diagnosis not present

## 2020-06-06 DIAGNOSIS — M9902 Segmental and somatic dysfunction of thoracic region: Secondary | ICD-10-CM | POA: Diagnosis not present

## 2020-06-06 DIAGNOSIS — M9905 Segmental and somatic dysfunction of pelvic region: Secondary | ICD-10-CM | POA: Diagnosis not present

## 2020-06-14 DIAGNOSIS — F32A Depression, unspecified: Secondary | ICD-10-CM | POA: Diagnosis not present

## 2020-06-14 DIAGNOSIS — O99342 Other mental disorders complicating pregnancy, second trimester: Secondary | ICD-10-CM | POA: Diagnosis not present

## 2020-06-14 DIAGNOSIS — Z3A27 27 weeks gestation of pregnancy: Secondary | ICD-10-CM | POA: Diagnosis not present

## 2020-06-14 DIAGNOSIS — O26892 Other specified pregnancy related conditions, second trimester: Secondary | ICD-10-CM | POA: Diagnosis not present

## 2020-06-14 DIAGNOSIS — R55 Syncope and collapse: Secondary | ICD-10-CM | POA: Diagnosis not present

## 2020-06-14 DIAGNOSIS — F419 Anxiety disorder, unspecified: Secondary | ICD-10-CM | POA: Diagnosis not present

## 2020-06-19 DIAGNOSIS — Z20828 Contact with and (suspected) exposure to other viral communicable diseases: Secondary | ICD-10-CM | POA: Diagnosis not present

## 2020-07-13 ENCOUNTER — Encounter (HOSPITAL_COMMUNITY): Payer: Self-pay

## 2020-07-13 ENCOUNTER — Other Ambulatory Visit: Payer: Self-pay

## 2020-07-13 ENCOUNTER — Inpatient Hospital Stay (HOSPITAL_COMMUNITY)
Admission: AD | Admit: 2020-07-13 | Discharge: 2020-07-13 | Disposition: A | Discharge status: Home / Self Care | Payer: Managed Care, Other (non HMO) | Attending: Obstetrics & Gynecology | Admitting: Obstetrics & Gynecology

## 2020-07-13 DIAGNOSIS — R55 Syncope and collapse: Secondary | ICD-10-CM | POA: Diagnosis present

## 2020-07-13 DIAGNOSIS — U071 COVID-19: Secondary | ICD-10-CM

## 2020-07-13 DIAGNOSIS — W182XXA Fall in (into) shower or empty bathtub, initial encounter: Secondary | ICD-10-CM | POA: Diagnosis not present

## 2020-07-13 DIAGNOSIS — R8271 Bacteriuria: Secondary | ICD-10-CM

## 2020-07-13 DIAGNOSIS — O26893 Other specified pregnancy related conditions, third trimester: Secondary | ICD-10-CM | POA: Diagnosis not present

## 2020-07-13 DIAGNOSIS — O98513 Other viral diseases complicating pregnancy, third trimester: Secondary | ICD-10-CM | POA: Diagnosis not present

## 2020-07-13 DIAGNOSIS — Z3A32 32 weeks gestation of pregnancy: Secondary | ICD-10-CM | POA: Diagnosis not present

## 2020-07-13 LAB — BASIC METABOLIC PANEL
Anion gap: 9 (ref 5–15)
BUN: 9 mg/dL (ref 6–20)
CO2: 24 mmol/L (ref 22–32)
Calcium: 9 mg/dL (ref 8.9–10.3)
Chloride: 102 mmol/L (ref 98–111)
Creatinine, Ser: 0.61 mg/dL (ref 0.44–1.00)
GFR, Estimated: >60 mL/min (ref >60)
Glucose, Bld: 80 mg/dL (ref 70–99)
Potassium: 3.9 mmol/L (ref 3.5–5.1)
Sodium: 135 mmol/L (ref 135–145)

## 2020-07-13 LAB — CBC
HCT: 33.9 % — ABNORMAL LOW (ref 36.0–46.0)
Hemoglobin: 11.6 g/dL — ABNORMAL LOW (ref 12.0–15.0)
MCH: 29.9 pg (ref 26.0–34.0)
MCHC: 34.2 g/dL (ref 30.0–36.0)
MCV: 87.4 fL (ref 80.0–100.0)
Platelets: 173 10*3/uL (ref 150–400)
RBC: 3.88 MIL/uL (ref 3.87–5.11)
RDW: 12 % (ref 11.5–15.5)
WBC: 10.1 10*3/uL (ref 4.0–10.5)
nRBC: 0 % (ref 0.0–0.2)

## 2020-07-13 LAB — URINALYSIS, ROUTINE W REFLEX MICROSCOPIC
Bilirubin Urine: NEGATIVE
Glucose, UA: NEGATIVE mg/dL
Hgb urine dipstick: NEGATIVE
Ketones, ur: NEGATIVE mg/dL
Nitrite: NEGATIVE
Protein, ur: NEGATIVE mg/dL
Specific Gravity, Urine: 1.015 (ref 1.005–1.030)
pH: 7 (ref 5.0–8.0)

## 2020-07-13 LAB — SARS CORONAVIRUS 2 BY RT PCR (HOSPITAL ORDER, PERFORMED IN ~~LOC~~ HOSPITAL LAB): SARS Coronavirus 2: POSITIVE — AB

## 2020-07-13 LAB — CBG MONITORING, ED: Glucose-Capillary: 72 mg/dL (ref 70–99)

## 2020-07-13 MED ORDER — SODIUM CHLORIDE 0.9 % IV BOLUS
1000.0000 mL | Freq: Once | INTRAVENOUS | Status: AC
  Administered 2020-07-13: 1000 mL via INTRAVENOUS

## 2020-07-13 MED ORDER — LACTATED RINGERS IV BOLUS: 1000.0000 mL | Freq: Once | INTRAVENOUS | Status: DC

## 2020-07-13 MED ORDER — CYCLOBENZAPRINE HCL 5 MG PO TABS
10.0000 mg | ORAL_TABLET | Freq: Once | ORAL | Status: AC
  Administered 2020-07-13: 10 mg via ORAL
  Filled 2020-07-13: qty 2

## 2020-07-13 MED ORDER — ACETAMINOPHEN 500 MG PO TABS
1000.0000 mg | ORAL_TABLET | Freq: Once | ORAL | Status: AC
  Administered 2020-07-13: 1000 mg via ORAL
  Filled 2020-07-13: qty 2

## 2020-07-13 MED ORDER — SODIUM CHLORIDE 0.9 % IV BOLUS
500.0000 mL | Freq: Once | INTRAVENOUS | Status: AC
  Administered 2020-07-13: 500 mL via INTRAVENOUS

## 2020-07-13 MED ORDER — CEPHALEXIN 500 MG PO CAPS: 500.0000 mg | ORAL_CAPSULE | Freq: Three times a day (TID) | ORAL | 0 refills | Status: DC

## 2020-07-13 NOTE — ED Notes (Signed)
Patient was given fluids without vomiting

## 2020-07-13 NOTE — Progress Notes (Signed)
G1P0 at [redacted] weeks gestation reports to [facility] after syncopal episode around 10am today in the shower.  Pt states hit her head.   Pain is 7/10 on right side of head.  Did not land on abdomen.  No bleeding or leaking reported.  No contractions reported or palpated.  Abdomen palpates soft and nontender.  Receives [facility] in [place].   Monitors applied for EFM.

## 2020-07-13 NOTE — Progress Notes (Signed)
Report to MAU Charge RN.  [facility] ETA 15 minutes.

## 2020-07-13 NOTE — ED Provider Notes (Signed)
Care assumed at shift change from [place], [place].  See her note for full HPI and work-up.  Briefly, patient with history of recurrent syncope, presenting to the ED G1P0 [redacted] weeks gestation.  She had syncopal episode in the shower this morning with possible head trauma.  She has no abdominal pain or contractions, no bleeding or leakage of fluids.  She has also had URI symptoms.  COVID swab pending.  Patient hydrated with 1.5 L normal saline.  Blood work is reassuring.  UA with bacteria present, plan to treat. nl neuro exam, no anticoagulation, CT head imaging is deferred.   Plan is for re-evaluation with rapid OB nurse evaluation. Prenatal care is with [facility]. Physical Exam  BP 121/79   Pulse 78   Temp 98 F (36.7 C) (Oral)   Resp (!) 21   Ht 5\' 7"  (1.702 m)   Wt 71.9 kg   SpO2 100%   BMI 24.84 kg/m   Physical Exam Vitals and nursing note reviewed.  Constitutional:      General: She is not in acute distress.    Appearance: She is well-developed and well-nourished.  HENT:     Head: Normocephalic and atraumatic.  Eyes:     Conjunctiva/sclera: Conjunctivae normal.  Cardiovascular:     Rate and Rhythm: Normal rate and regular rhythm.  Pulmonary:     Effort: Pulmonary effort is normal.  Abdominal:     Palpations: Abdomen is soft.     Comments: gravid  Skin:    General: Skin is warm.  Neurological:     Mental Status: She is alert.  Psychiatric:        Mood and Affect: Mood and affect and mood normal.        Behavior: Behavior normal.     ED Course/Procedures     Procedures Results for orders placed or performed during the hospital encounter of 32/95/18  Basic metabolic panel  Result Value Ref Range   Sodium 135 135 - 145 mmol/L   Potassium 3.9 3.5 - 5.1 mmol/L   Chloride 102 98 - 111 mmol/L   CO2 24 22 - 32 mmol/L   Glucose, Bld 80 70 - 99 mg/dL   BUN 9 6 - 20 mg/dL   Creatinine, Ser 0.61 0.44 - 1.00 mg/dL   Calcium 9.0 8.9 - 10.3 mg/dL   GFR, Estimated >60 >60  mL/min   Anion gap 9 5 - 15  CBC  Result Value Ref Range   WBC 10.1 4.0 - 10.5 K/uL   RBC 3.88 3.87 - 5.11 MIL/uL   Hemoglobin 11.6 (L) 12.0 - 15.0 g/dL   HCT 33.9 (L) 36.0 - 46.0 %   MCV 87.4 80.0 - 100.0 fL   MCH 29.9 26.0 - 34.0 pg   MCHC 34.2 30.0 - 36.0 g/dL   RDW 12.0 11.5 - 15.5 %   Platelets 173 150 - 400 K/uL   nRBC 0.0 0.0 - 0.2 %  Urinalysis, Routine w reflex microscopic  Result Value Ref Range   Color, Urine YELLOW YELLOW   APPearance CLOUDY (A) CLEAR   Specific Gravity, Urine 1.015 1.005 - 1.030   pH 7.0 5.0 - 8.0   Glucose, UA NEGATIVE NEGATIVE mg/dL   Hgb urine dipstick NEGATIVE NEGATIVE   Bilirubin Urine NEGATIVE NEGATIVE   Ketones, ur NEGATIVE NEGATIVE mg/dL   Protein, ur NEGATIVE NEGATIVE mg/dL   Nitrite NEGATIVE NEGATIVE   Leukocytes,Ua TRACE (A) NEGATIVE   RBC / HPF 0-5 0 - 5 RBC/hpf  WBC, UA 6-10 0 - 5 WBC/hpf   Bacteria, UA FEW (A) NONE SEEN   Squamous Epithelial / LPF 0-5 0 - 5   Mucus PRESENT    Hyaline Casts, UA PRESENT   CBG monitoring, ED  Result Value Ref Range   Glucose-Capillary 72 70 - 99 mg/dL   No results found.  MDM  Rapid OB nurse, Larene Beach, reports intermittent contractions every 8 or so minutes, normal fetal heart tones.  Recommends 4-hour monitoring considering head injury.  She will be transferred to the MAU for continued monitoring.  Also recommended additional liter of IV fluids which is given.  Delay in COVID swab result.  Discussed with patient and OB nurse consideration of chest x-ray if COVID swab is negative.  Patient is in agreement.  OB nurse agrees patient can obtain x-ray from the MAU as needed.  Patient states she is ambulated in the ED without difficulty or recurrent syncope or lightheadedness.  Patient is stable for transfer at this time to the MAU for further OB monitoring.  Patient is given Rx for Keflex for UTI.  Urine culture is ordered and sent.     Tiaja Hagan, [place] N, PA-C 07/13/20 Kathaleen Maser    Malvin Johns,  MD 07/14/20 [phone]

## 2020-07-13 NOTE — MAU Note (Signed)
Pt presents to MAU via EMS transfer from [facility] for extended [facility] following LOC earlier this morning.  Pt c/o mild irregular ctx, denies vaginal bleeding or discharge.  Endorses +FM.

## 2020-07-13 NOTE — ED Notes (Signed)
[facility] and rapid response nurse called r/t + COVID test

## 2020-07-13 NOTE — Progress Notes (Signed)
Spoke with MAU Agricultural consultant.  Have availability to provide continuous monitoring for patient.  Dr Harolyn Rutherford updated on pt status.  Accepted care of patient.  [facility] will start process of transferring to MAU.

## 2020-07-13 NOTE — ED Provider Notes (Signed)
[facility] DEPT Provider Note   CSN: [id number] Arrival date & time: 07/13/20  1043     History Chief Complaint  Patient presents with  . Loss of Consciousness  . Shortness of Breath    Teresa Garrett is a 25 y.o. female with PMH/o asthma, compartment syndrome, who is currently [redacted] weeks pregnant who presents for evaluation of syncopal episode. Patient reports that today she was in the shower and felt lightheaded and like she was going to pass out. She states that she lowered herself to the ground to prevent from falling but states she did not lower herself in time and fell a small distance. She thinks she might have hit her head. She did not have any preceding chest pain.  She states that she has been feeling bad for the last week or so.  She has had some nasal congestion, rhinorrhea, shortness of breath, cough.  She she had a fever earlier this week that has since resolved.  She reports that about 3 weeks ago, she felt bad.  She had a COVID test that was negative at that time.  She then felt better and then symptoms returned.  She has been vaccinated x2.  She states that she has not any abdominal pain.  She did not hit her abdomen when she fell.  No nausea/vomiting, vaginal bleeding, loss of fluid, nausea/vomiting, numbness/weakness. She is not on blood thinners.   The history is provided by the patient.       Past Medical History:  Diagnosis Date  . Asthma    as a child  . Compartment syndrome of right lower extremity ([place])   . Complication of anesthesia   . Concussion   . Family history of breast cancer   . Headache    migraines-   . PONV (postoperative nausea and vomiting)     Patient Active Problem List   Diagnosis Date Noted  . Genetic testing 03/02/2019  . Personal history of colonic polyps 01/28/2019  . Family history of breast cancer   . Compartment syndrome of lower extremity due to exertion, right   . Medial tibial stress syndrome,  left, initial encounter 04/30/2016    Past Surgical History:  Procedure Laterality Date  . FASCIOTOMY Right 07/03/2018   Procedure: ANTERIOR AND LATERAL COMPARTMENT RELEASE RIGHT LEG;  Surgeon: Newt Minion, MD;  Location: [facility];  Service: Orthopedics;  Laterality: Right;  . WISDOM TOOTH EXTRACTION     "gas"     OB History    Gravida  1   Para      Term      Preterm      AB      Living        SAB      IAB      Ectopic      Multiple      Live Births              Family History  Problem Relation Age of Onset  . Breast cancer Other        dx 40s  . Colon cancer Neg Hx   . Esophageal cancer Neg Hx   . Pancreatic cancer Neg Hx   . Stomach cancer Neg Hx   . Liver disease Neg Hx     Social History   Tobacco Use  . Smoking status: Never Smoker  . Smokeless tobacco: Never Used  Vaping Use  . Vaping Use: Never used  Substance  Use Topics  . Alcohol use: No  . Drug use: No    Home Medications Prior to Admission medications   Medication Sig Start Date End Date Taking? Authorizing Provider  Prenatal Vit-Fe Fumarate-FA (PRENATAL PO) Take 1 tablet by mouth daily.   Yes [provider]  sertraline (ZOLOFT) 100 MG tablet Take 100 mg by mouth daily. 06/27/20  Yes [provider]    Allergies    Apple  Review of Systems   Review of Systems  Constitutional: Positive for fever.  HENT: Positive for congestion.   Respiratory: Positive for cough and shortness of breath.   Cardiovascular: Negative for chest pain.  Gastrointestinal: Negative for abdominal pain, nausea and vomiting.  Genitourinary: Negative for dysuria, hematuria and vaginal bleeding.  Neurological: Positive for syncope. Negative for headaches.  All other systems reviewed and are negative.   Physical Exam Updated Vital Signs BP 120/67 (BP Location: Right Arm)   Pulse 74   Temp 98.1 F (36.7 C) (Oral)   Resp 14   Ht 5\' 7"  (1.702 m)   Wt 71.7 kg   SpO2 98%   BMI 24.75  kg/m   Physical Exam Vitals and nursing note reviewed.  Constitutional:      Appearance: Normal appearance. She is well-developed and well-nourished.  HENT:     Head: Normocephalic and atraumatic.     Mouth/Throat:     Mouth: Oropharynx is clear and moist and mucous membranes are normal.  Eyes:     General: Lids are normal.     Extraocular Movements: EOM normal.     Conjunctiva/sclera: Conjunctivae normal.     Pupils: Pupils are equal, round, and reactive to light.  Cardiovascular:     Rate and Rhythm: Normal rate and regular rhythm.     Pulses: Normal pulses.     Heart sounds: Normal heart sounds. No murmur heard. No friction rub. No gallop.   Pulmonary:     Effort: Pulmonary effort is normal.     Breath sounds: Rales present.     Comments: Mild rales noted to the bases. Able to speak in full sentence without any difficulty. No wheezing. No evidence of respiratory distress.  Abdominal:     Palpations: Abdomen is not rigid.     Tenderness: There is no abdominal tenderness. There is no guarding.     Comments: Gravid abdomen. No tenderness.   Musculoskeletal:        General: Normal range of motion.     Cervical back: Full passive range of motion without pain.  Skin:    General: Skin is warm and dry.     Capillary Refill: Capillary refill takes less than 2 seconds.  Neurological:     Mental Status: She is alert and oriented to person, place, and time.     Comments: Cranial nerves III-XII intact Follows commands, Moves all extremities  5/5 strength to BUE and BLE  Sensation intact throughout all major nerve distributions Normal finger to nose. No slurred speech. No facial droop.   Psychiatric:        Mood and Affect: Mood and affect normal.        Speech: Speech normal.     ED Results / Procedures / Treatments   Labs (all labs ordered are listed, but only abnormal results are displayed) Labs Reviewed  SARS CORONAVIRUS 2 BY RT PCR (HOSPITAL ORDER, [facility]) - Abnormal; Notable for the following components:      Result Value  SARS Coronavirus 2 POSITIVE (*)    All other components within normal limits  CBC - Abnormal; Notable for the following components:   Hemoglobin 11.6 (*)    HCT 33.9 (*)    All other components within normal limits  URINALYSIS, ROUTINE W REFLEX MICROSCOPIC - Abnormal; Notable for the following components:   APPearance CLOUDY (*)    Leukocytes,Ua TRACE (*)    Bacteria, UA FEW (*)    All other components within normal limits  URINE CULTURE  BASIC METABOLIC PANEL  CBG MONITORING, ED    EKG EKG Interpretation  Date/Time:  Thursday July 13 2020 10:59:46 EST Ventricular Rate:  97 PR Interval:    QRS Duration: 83 QT Interval:  333 QTC Calculation: 423 R Axis:   85 Text Interpretation: Sinus rhythm Borderline short PR interval Borderline Q waves in lateral leads 12 Lead; Mason-Likar NSR, t wave changes present on previous Confirmed by Lavenia Atlas [phone]) on 07/13/2020 11:35:20 AM   Radiology No results found.  Procedures Procedures (including critical care time)  Medications Ordered in ED Medications  sodium chloride 0.9 % bolus 1,000 mL (0 mLs Intravenous Stopped 07/13/20 1449)  sodium chloride 0.9 % bolus 500 mL (0 mLs Intravenous Stopped 07/13/20 1612)  sodium chloride 0.9 % bolus 1,000 mL (0 mLs Intravenous Stopped 07/13/20 2250)  acetaminophen (TYLENOL) tablet 1,000 mg (1,000 mg Oral Given 07/13/20 2016)  cyclobenzaprine (FLEXERIL) tablet 10 mg (10 mg Oral Given 07/13/20 2017)    ED Course  I have reviewed the triage vital signs and the nursing notes.  Pertinent labs & imaging results that were available during my care of the patient were reviewed by me and considered in my medical decision making (see chart for details).    MDM Rules/Calculators/A&P                          25 year old female who is [redacted] weeks pregnant who presents for evaluation of syncopal episode.  She  reports that today while she was in the shower, she felt lightheaded and had a syncopal episode.  She does not think she hit her stomach.  She has felt baby move.  No vaginal bleeding.  She reports she has not felt well for about a week or so.  She states she has had some shortness of breath, cough, nasal congestion.  She has COVID-vaccine x2.  No COVID exposure that she knows of.  On initial arrival, she is afebrile, nontoxic-appearing.  Vital signs are stable.  She has some mild rales noted at the bases.  Neuro exam is normal.  She is not on any blood thinners.  She denies any nausea/vomiting, numbness/weakness of arms or legs.  Low suspicion for intracranial hemorrhage, traumatic injury.  I discussed with patient regarding imaging, including CT head, chest x-ray.  Patient with reassuring neuro exam.  She states that she may have had LOC but denies any blood thinner use, nausea/vomiting, numbness/weakness of her arms or legs.  Additionally, patient with no respiratory distress.  She does have some mild rales noted in the bases that could be consistent with upper or respiratory infection such as COVID or pneumonia.  At this time, patient would like to hold off any imaging given that she is pregnant.  Feel that this is reasonable given her overall well appearance, reassuring neuro exam and low risk.  We will plan to check labs, orthostatics, COVID test.  CBG is 72.  BMP is unremarkable.  CBC shows no leukocytosis.  Hemoglobin stable 11.6.  UA shows trace leukocytes, bacteria.  Orthostatic VS for the past 24 hrs:  BP- Lying Pulse- Lying BP- Sitting Pulse- Sitting BP- Standing at 0 minutes Pulse- Standing at 0 minutes  07/13/20 1308 119/72 80 116/75 100 108/78 116    Patient orthostatic here in the ED.  This could be contributing to her syncopal episode.  Discussed with rapid OB nurse.  She will plan to see patient.  She is in a delivery right now but will come monitor patient afterwards.  Patient signed  out to Martinique Robinson, PA-C with COVID test pending.   Portions of this note were generated with [employer]. Dictation errors may occur despite best attempts at proofreading.   Final Clinical Impression(s) / ED Diagnoses Final diagnoses:  Syncope, unspecified syncope type  Asymptomatic bacteriuria    Rx / DC Orders ED Discharge Orders         Ordered    cephALEXin (KEFLEX) 500 MG capsule  3 times daily,   Status:  Discontinued        07/13/20 1829           Volanda Napoleon, PA-C 07/14/20 1206    Malvin Johns, MD 07/17/20 [id number]

## 2020-07-13 NOTE — MAU Provider Note (Addendum)
History     [id number]  Arrival date and time: 07/13/20 1043    Chief Complaint  Patient presents with  . Loss of Consciousness  . Shortness of Breath     HPI Teresa Garrett is a 25 y.o. at [redacted]w[redacted]d by LMP c/w 20wk anatomy scan with PMHx notable for recurrent syncope, who presents after transfer from [facility] ED for prolonged monitoring after a fall.   ED Records from [facility] ED reviewed: reported she fell in the shower with possible head strike and no abdominal trauma, unremarkable exam and head CT deferred. Positive orthostatics by blood pressure. Given 1.5L IVF and transferred to MAU. Also found to be COVID+ with mild symptoms. Initial BP elevated but appears spurious given remainder of pressures low normal.   Here patient reports she feels overall well. Reports that around 9 AM she was in the shower when she felt light headed and dizzy and started to lower herself to the ground. She thinks she lost consciousness completely for an unclear period of time, she thinks she probably hit her head but feels that based on the way she was lowering herself down she did not hit her abdomen. Has felt over the past few weeks what she assumes are [facility], since falling she feels that she is having a similar intensity of contraction but more frequent. Denies abdominal pain, vaginal bleeding, has good fetal movement, no loss of fluid.   With regard to COVID symptoms they started five days prior. Mainly just congestion and cough, no chest pain or SOB. She has received 2 doses of COVID vaccine.       OB History    Gravida  1   Para      Term      Preterm      AB      Living        SAB      IAB      Ectopic      Multiple      Live Births              Past Medical History:  Diagnosis Date  . Asthma    as a child  . Compartment syndrome of right lower extremity ([place])   . Complication of anesthesia   . Concussion   . Family history of breast cancer   . Headache    migraines-    . PONV (postoperative nausea and vomiting)     Past Surgical History:  Procedure Laterality Date  . FASCIOTOMY Right 07/03/2018   Procedure: ANTERIOR AND LATERAL COMPARTMENT RELEASE RIGHT LEG;  Surgeon: Newt Minion, MD;  Location: [facility];  Service: Orthopedics;  Laterality: Right;  . WISDOM TOOTH EXTRACTION     "gas"    Family History  Problem Relation Age of Onset  . Breast cancer Other        dx 63s  . Colon cancer Neg Hx   . Esophageal cancer Neg Hx   . Pancreatic cancer Neg Hx   . Stomach cancer Neg Hx   . Liver disease Neg Hx     Social History   Socioeconomic History  . Marital status: Single    Spouse name: Not on file  . Number of children: Not on file  . Years of education: Not on file  . Highest education level: Not on file  Occupational History  . Not on file  Tobacco Use  . Smoking status: Never Smoker  . Smokeless tobacco: Never  Used  Vaping Use  . Vaping Use: Never used  Substance and Sexual Activity  . Alcohol use: No  . Drug use: No  . Sexual activity: Yes    Birth control/protection: None  Other Topics Concern  . Not on file  Social History Narrative  . Not on file   Social Determinants of Health   Financial Resource Strain: Not on file  Food Insecurity: Not on file  Transportation Needs: Not on file  Physical Activity: Not on file  Stress: Not on file  Social Connections: Not on file  Intimate Partner Violence: Not on file    Allergies  Allergen Reactions  . Apple Itching and Nausea Only    No current facility-administered medications on file prior to encounter.   Current Outpatient Medications on File Prior to Encounter  Medication Sig Dispense Refill  . Prenatal Vit-Fe Fumarate-FA (PRENATAL PO) Take 1 tablet by mouth daily.    . sertraline (ZOLOFT) 100 MG tablet Take 100 mg by mouth daily.    Marland Kitchen lubiprostone (AMITIZA) 24 MCG capsule Take 1 capsule (24 mcg total) by mouth 2 (two) times daily with a meal. (Patient not taking: No  sig reported) 60 capsule 3     ROS Pertinent positives and negative per HPI, all others reviewed and negative  Physical Exam   BP 124/73 (BP Location: Right Arm)   Pulse 82   Temp 98.1 F (36.7 C) (Oral)   Resp 18   Ht 5\' 7"  (1.702 m)   Wt 71.7 kg   SpO2 99%   BMI 24.75 kg/m   Patient Vitals for the past 24 hrs:  BP Temp Temp src Pulse Resp SpO2 Height Weight  07/13/20 1946 124/73 98.1 F (36.7 C) Oral 82 18 99 % 5\' 7"  (1.702 m) 71.7 kg  07/13/20 1900 115/70 -- -- 85 (!) 21 97 % -- --  07/13/20 1841 -- -- -- 83 (!) 22 99 % -- --  07/13/20 1830 -- -- -- 94 (!) 21 -- -- --  07/13/20 1800 -- -- -- 92 19 -- -- --  07/13/20 1730 -- -- -- 84 19 -- -- --  07/13/20 1700 -- -- -- 78 (!) 21 -- -- --  07/13/20 1600 121/79 -- -- 78 (!) 21 100 % -- --  07/13/20 1535 -- -- -- -- -- 100 % -- --  07/13/20 1500 (!) 109/58 -- -- 66 (!) 21 100 % -- --  07/13/20 1400 (!) 106/57 -- -- 75 15 100 % -- --  07/13/20 1300 109/78 -- -- 90 16 100 % -- --  07/13/20 1223 -- -- -- 94 15 100 % -- --  07/13/20 1222 119/89 -- -- 93 -- 100 % -- --  07/13/20 1217 -- -- -- -- -- -- 5\' 7"  (1.702 m) 71.9 kg  07/13/20 1200 -- -- -- 99 19 100 % -- --  07/13/20 1130 -- -- -- 92 (!) 26 100 % -- --  07/13/20 1055 (!) 127/91 98 F (36.7 C) Oral 88 20 97 % -- --    Physical Exam Vitals reviewed.  Constitutional:      General: She is not in acute distress.    Appearance: She is well-developed and well-nourished. She is not diaphoretic.  Eyes:     General: No scleral icterus. Pulmonary:     Effort: Pulmonary effort is normal. No respiratory distress.  Abdominal:     General: There is no distension.     Palpations: Abdomen is  soft.     Tenderness: There is no abdominal tenderness. There is no guarding or rebound.  Musculoskeletal:        General: No edema.  Skin:    General: Skin is warm and dry.  Neurological:     Mental Status: She is alert.     Coordination: Coordination normal.  Psychiatric:         Mood and Affect: Mood and affect normal.      Cervical Exam Dilation: Closed Effacement (%): Thick Cervical Position: Posterior Exam by:: Dr. Dione Plover  Bedside Ultrasound Not done  My interpretation: n/a  FHT Baseline 135, moderate variability, +accels, no decels Toco: initially with irritability, then quiet Cat: I  Labs Results for orders placed or performed during the hospital encounter of 07/13/20 (from the past 24 hour(s))  Basic metabolic panel     Status: None   Collection Time: 07/13/20 12:12 PM  Result Value Ref Range   Sodium 135 135 - 145 mmol/L   Potassium 3.9 3.5 - 5.1 mmol/L   Chloride 102 98 - 111 mmol/L   CO2 24 22 - 32 mmol/L   Glucose, Bld 80 70 - 99 mg/dL   BUN 9 6 - 20 mg/dL   Creatinine, Ser 0.61 0.44 - 1.00 mg/dL   Calcium 9.0 8.9 - 10.3 mg/dL   GFR, Estimated >60 >60 mL/min   Anion gap 9 5 - 15  CBC     Status: Abnormal   Collection Time: 07/13/20 12:12 PM  Result Value Ref Range   WBC 10.1 4.0 - 10.5 K/uL   RBC 3.88 3.87 - 5.11 MIL/uL   Hemoglobin 11.6 (L) 12.0 - 15.0 g/dL   HCT 33.9 (L) 36.0 - 46.0 %   MCV 87.4 80.0 - 100.0 fL   MCH 29.9 26.0 - 34.0 pg   MCHC 34.2 30.0 - 36.0 g/dL   RDW 12.0 11.5 - 15.5 %   Platelets 173 150 - 400 K/uL   nRBC 0.0 0.0 - 0.2 %  CBG monitoring, ED     Status: None   Collection Time: 07/13/20 12:18 PM  Result Value Ref Range   Glucose-Capillary 72 70 - 99 mg/dL  Urinalysis, Routine w reflex microscopic     Status: Abnormal   Collection Time: 07/13/20 12:20 PM  Result Value Ref Range   Color, Urine YELLOW YELLOW   APPearance CLOUDY (A) CLEAR   Specific Gravity, Urine 1.015 1.005 - 1.030   pH 7.0 5.0 - 8.0   Glucose, UA NEGATIVE NEGATIVE mg/dL   Hgb urine dipstick NEGATIVE NEGATIVE   Bilirubin Urine NEGATIVE NEGATIVE   Ketones, ur NEGATIVE NEGATIVE mg/dL   Protein, ur NEGATIVE NEGATIVE mg/dL   Nitrite NEGATIVE NEGATIVE   Leukocytes,Ua TRACE (A) NEGATIVE   RBC / HPF 0-5 0 - 5 RBC/hpf   WBC, UA 6-10  0 - 5 WBC/hpf   Bacteria, UA FEW (A) NONE SEEN   Squamous Epithelial / LPF 0-5 0 - 5   Mucus PRESENT    Hyaline Casts, UA PRESENT   SARS Coronavirus 2 by RT PCR (hospital order, performed in [facility] hospital lab) Nasopharyngeal Nasopharyngeal Swab     Status: Abnormal   Collection Time: 07/13/20  1:07 PM   Specimen: Nasopharyngeal Swab  Result Value Ref Range   SARS Coronavirus 2 POSITIVE (A) NEGATIVE    Imaging No results found.  MAU Course  Procedures  Lab Orders     SARS Coronavirus 2 by RT PCR (hospital order, performed in  [facility] hospital lab) Nasopharyngeal Nasopharyngeal Swab     Basic metabolic panel     CBC     Urinalysis, Routine w reflex microscopic     CBG monitoring, ED Meds ordered this encounter  Medications  . sodium chloride 0.9 % bolus 1,000 mL  . sodium chloride 0.9 % bolus 500 mL  . DISCONTD: lactated ringers bolus 1,000 mL  . sodium chloride 0.9 % bolus 1,000 mL  . cephALEXin (KEFLEX) 500 MG capsule    Sig: Take 1 capsule (500 mg total) by mouth 3 (three) times daily for 7 days.    Dispense:  21 capsule    Refill:  0    Order Specific Question:   Supervising Provider    Answer:   MILLER, BRIAN [[id number]]  . acetaminophen (TYLENOL) tablet 1,000 mg  . cyclobenzaprine (FLEXERIL) tablet 10 mg   Imaging Orders  No imaging studies ordered today    MDM moderate  Assessment and Plan  #Abdominal trauma #Vasovagal syncope Episode c/w vasovagal syncope, no features to suggest cardiac or seizure etiology and orthostatics were positive. Patient well appearing and >12h from the time of fall. Blood type O+, rhogam not indicated. Most recent tracing very reassuring with Cat I strip and no contractions. Given positive orthostatics, advised to maintain good hydration to avoid future episodes. Reviewed return precautions in detail with emphasis on bleeding, contractions, and DFM.   #COVID Five days out from start of mild symptoms, will message [facility]  providers to see if she can receive MAB or other treatments.   #FWB FHT Cat I NST: Reactive  Discharged to home in stable condition.   Clarnce Flock, MD/MPH 07/13/20 10:14 PM  Allergies as of 07/13/2020      Reactions   Apple Itching, Nausea Only      Medication List    STOP taking these medications   lubiprostone 24 MCG capsule Commonly known as: Amitiza     TAKE these medications   cephALEXin 500 MG capsule Commonly known as: KEFLEX Take 1 capsule (500 mg total) by mouth 3 (three) times daily for 7 days.   PRENATAL PO Take 1 tablet by mouth daily.   sertraline 100 MG tablet Commonly known as: ZOLOFT Take 100 mg by mouth daily.

## 2020-07-13 NOTE — ED Triage Notes (Addendum)
Pt arrived via walk in, [redacted]wks pregnant, states she was in the shower, and had a syncopal episode in the shower when she started having worsening SOB. Hit head on shower floor.  States she has been SOB, nasal congestions on and off for the last two weeks. Denies any known sick contacts. States had syncopal episode in beginning of pregnancy thought to be hypotensive episode. Denies any issues regarding pregnancy, still feeling baby move.

## 2020-07-13 NOTE — Progress Notes (Signed)
Talked with PA.  Pt has been here in ED since 11am.  Has been cleared by ED for head injury.  Patient also reports congestion for one week and low grade fever at beginning of week.  Pending Covid test in ED. Has received 1 1/2 liters of NS currently in ED.  Hung another liter of NS per order. Will discuss possiblity of transfer to [facility] MAU for further EFM.  Pt is contracting 6-9 minutes without distress to patient.

## 2020-07-15 LAB — URINE CULTURE: Culture: NO GROWTH

## 2020-09-02 DIAGNOSIS — O139 Gestational [pregnancy-induced] hypertension without significant proteinuria, unspecified trimester: Secondary | ICD-10-CM | POA: Insufficient documentation

## 2020-09-02 DIAGNOSIS — Z8759 Personal history of other complications of pregnancy, childbirth and the puerperium: Secondary | ICD-10-CM | POA: Insufficient documentation

## 2021-06-07 IMAGING — US US OB LIMITED
1 series · 14 of 28 positions shown · non-contrast
Comparison: none

CLINICAL DATA: Pregnancy.  Abdominal pain.

EXAM:
LIMITED OBSTETRIC ULTRASOUND

[Series 1: us ob limited · 51 acquisitions, 14 frames shown]
[im 2/51]
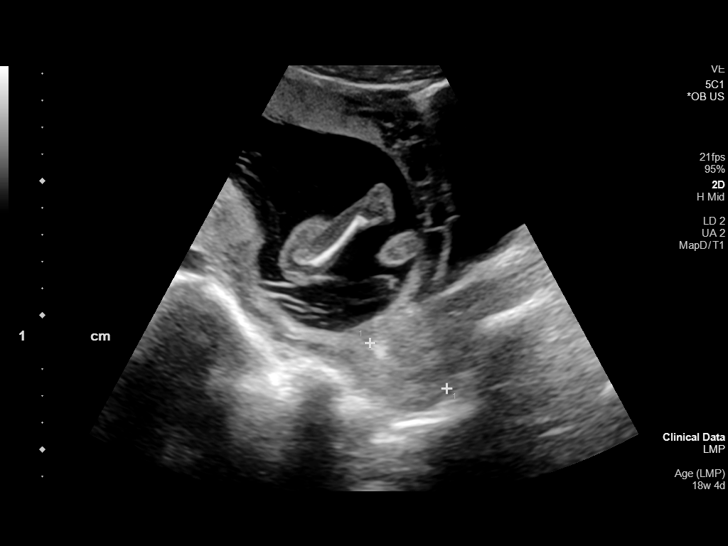
[im 6/51]
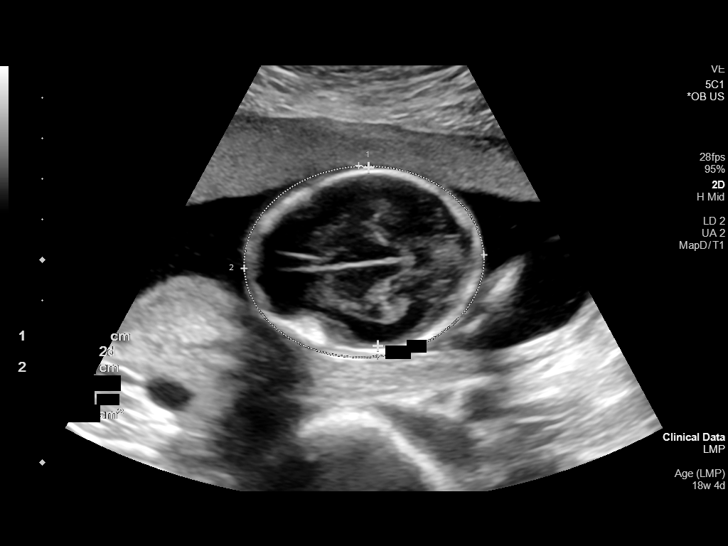
[im 10/51]
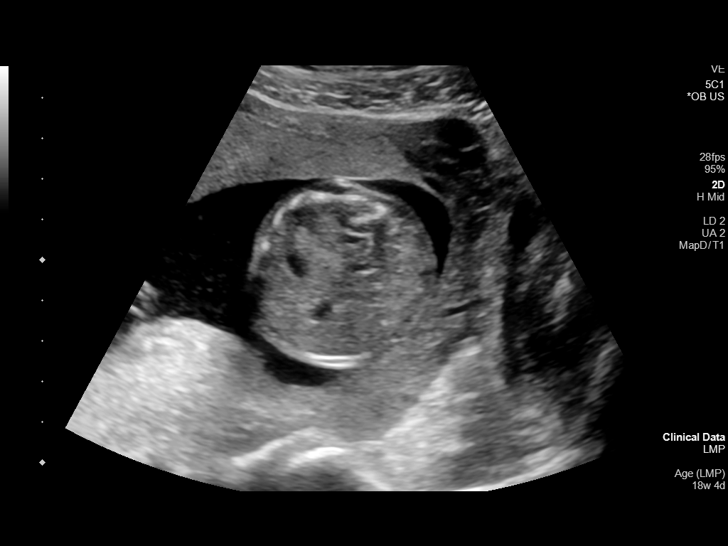
[im 13/51]
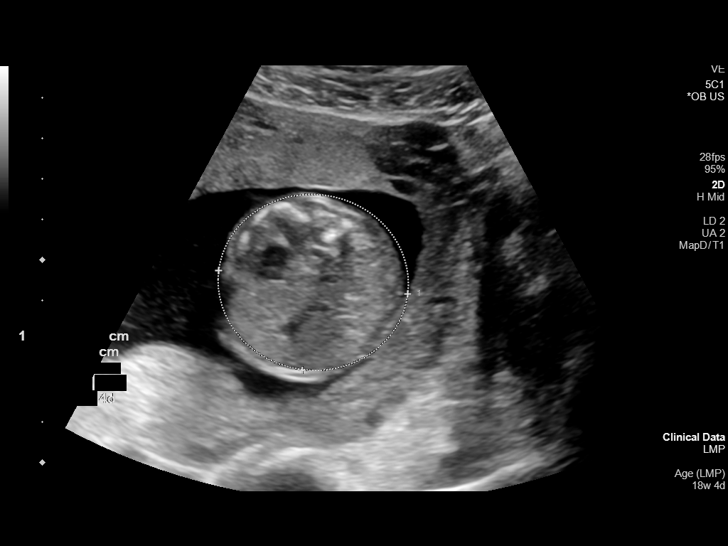
[im 17/51]
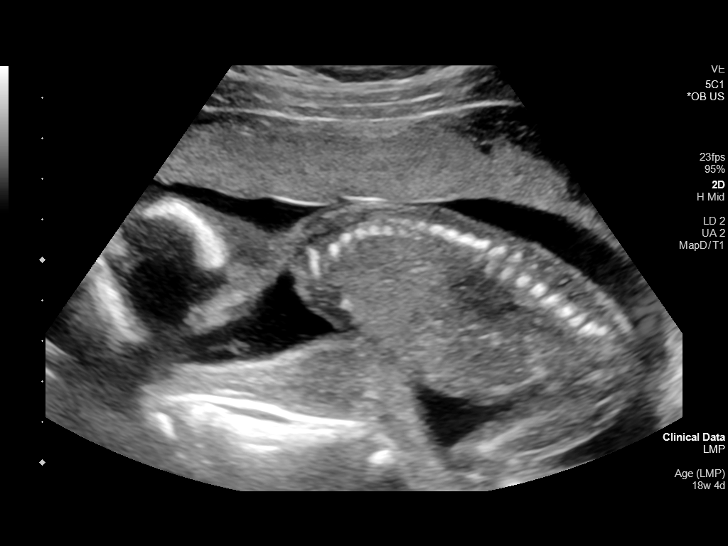
[im 21/51]
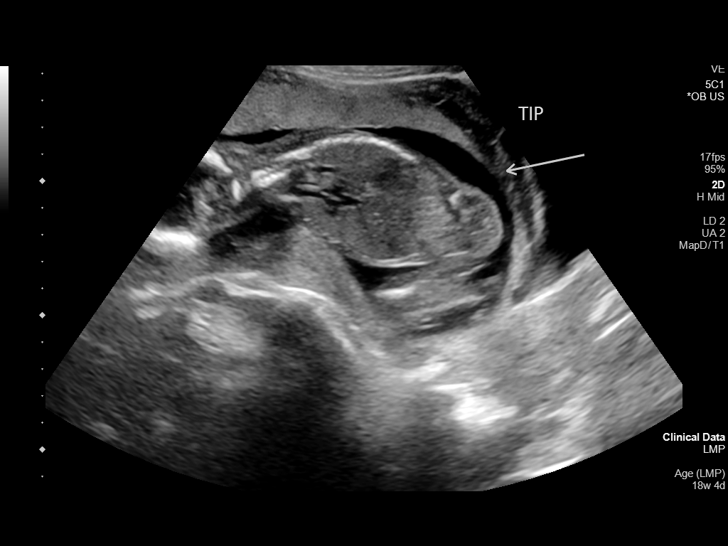
[im 25/51]
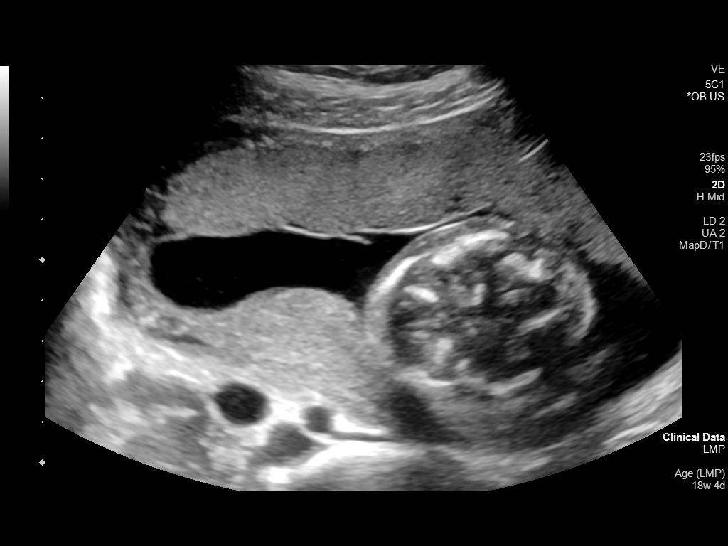
[im 28/51]
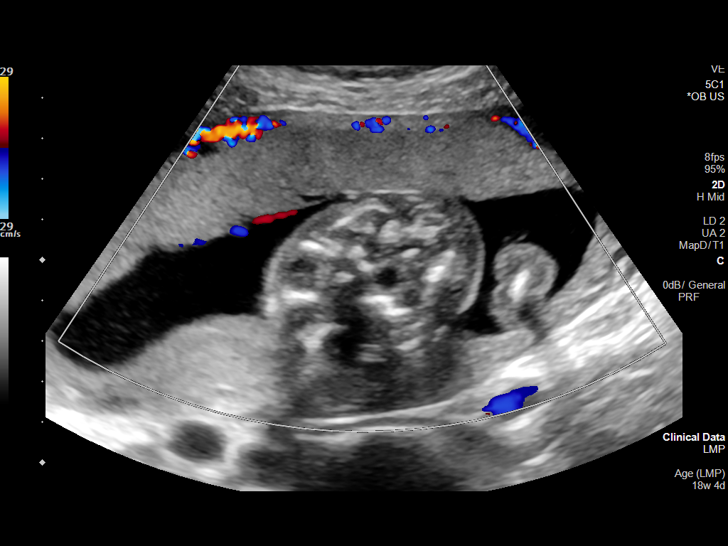
[im 32/51]
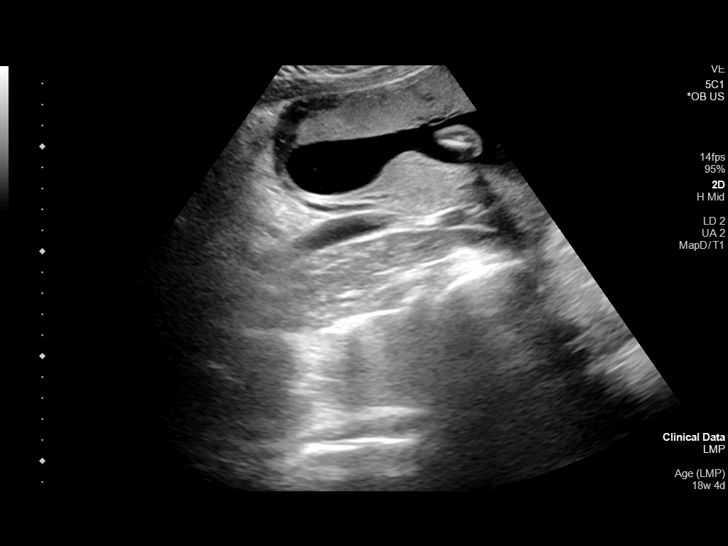
[im 36/51]
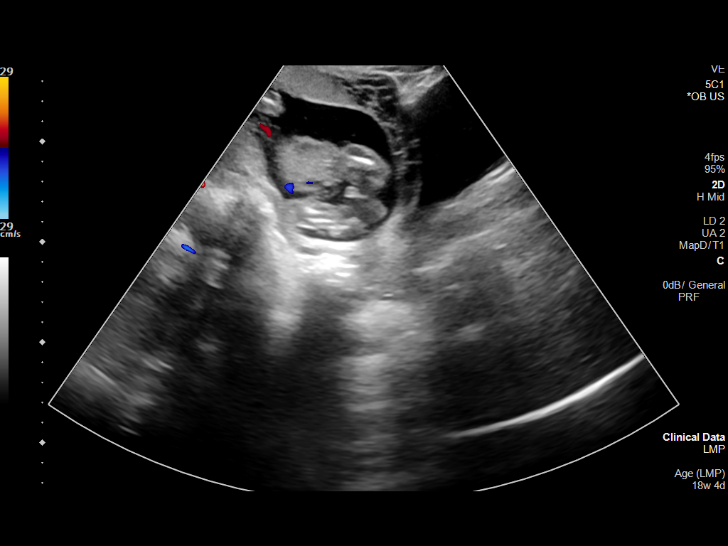
[im 39/51]
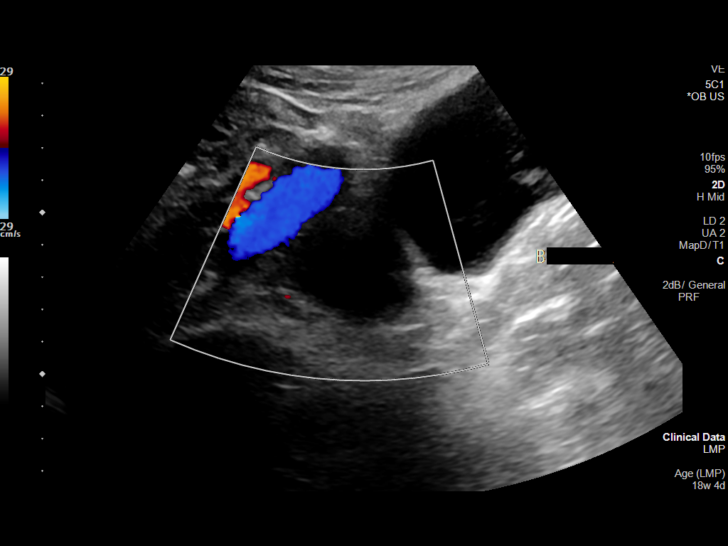
[im 43/51]
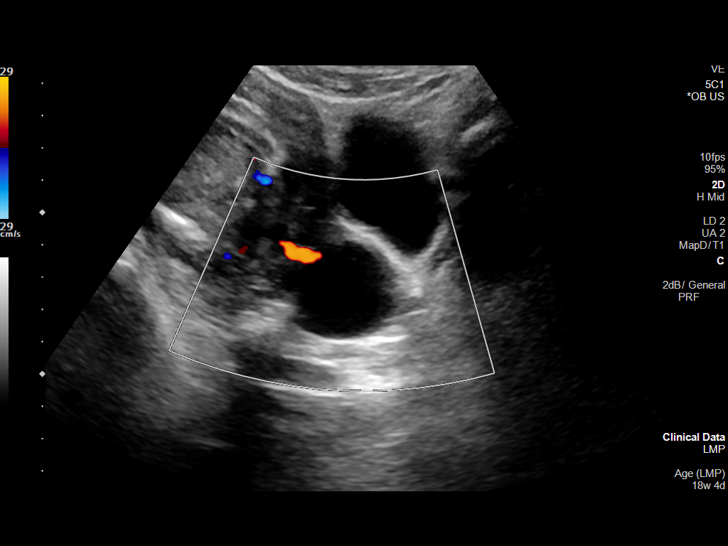
[im 47/51]
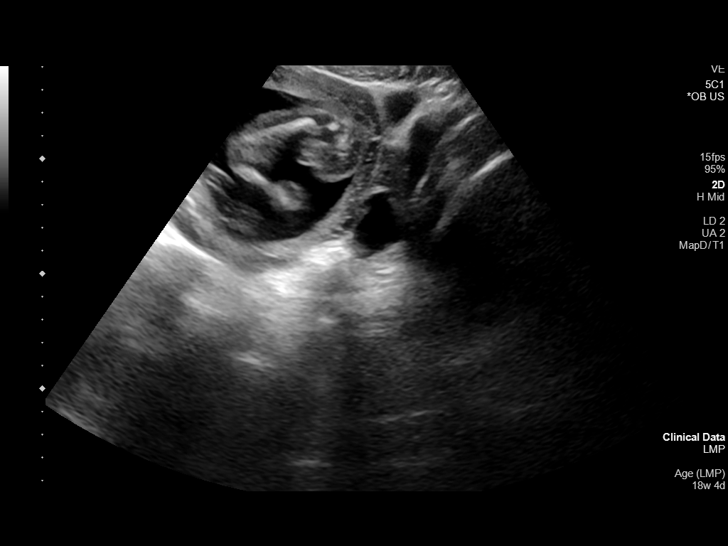
[im 51/51]
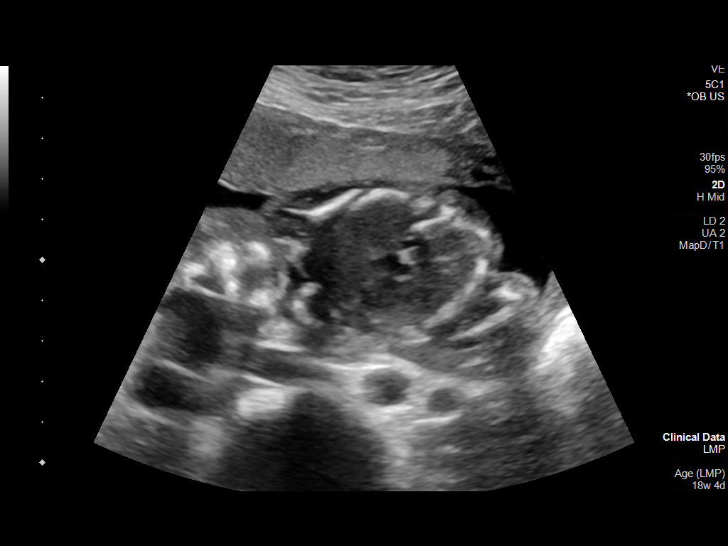

[14 of 28 positions shown; findings below may reference images not displayed]

FINDINGS: Number of Fetuses: 1

Heart Rate:  141 bpm

Movement: Yes

Presentation: Breech

Placental Location: Anterior

Previa: No

Amniotic Fluid (Subjective):  Within normal limits.

BPD: 4.4 cm 19 w  2 d

MATERNAL FINDINGS:

Cervix:  Appears closed.

Uterus/Adnexae: Mildly complex left adnexal cyst measuring up to
cm.
IMPRESSION: 1. Single live intrauterine gestation measuring 19 weeks 2 days by
BPD.
2. Active fetal heart tones at 141 bpm.
3. Incidental note of a mildly complex left adnexal cyst measuring
up to 5.2 cm. Follow-up ultrasound in 6-12 weeks is recommended to
ensure resolution.

This exam is performed on an emergent basis and does not
comprehensively evaluate fetal size, dating, or anatomy; follow-up
complete OB US should be considered if further fetal assessment is
warranted.

## 2021-08-28 NOTE — Progress Notes (Signed)
? ? ?  Subjective:   ? ?CC: L foot pain ? ?I, Wendy Poet, LAT, ATC, am serving as scribe for Dr. Lynne Leader. ? ?HPI: Pt is a 26 y/o female c/o L foot pain ongoing since yesterday w/ no MOI.  She locates her pain to the dorsum of the L foot along the 3-5th MT. Pt does not recall any injury, but notes there is bruising over the area. ? ?She is currently training for a marathon.  She has a marathon in [place] in August.  She is hoping to qualify for the [employer].  She has run a half marathon before but never run a full marathon.  Her current long run is 6.5 miles. ? ?L foot swelling: yes- and bruised ?Aggravating factors: walking, toes flex/ext ?Treatments tried: ice, IBU ? ?Pertinent review of Systems: No fevers or chills ? ?Relevant historical information: History of exertional compartment syndrome.  She had a baby a year ago.  She works as a Actor.  No history of stress fractures. ? ? ?Objective:   ? ?Vitals:  ? 08/29/21 0811  ?BP: 104/74  ?Pulse: (!) 54  ?SpO2: 99%  ? ?General: Well Developed, well nourished, and in no acute distress.  ? ?MSK: Left foot slight swelling at dorsal lateral forefoot. ?Tender palpation at fourth metatarsal shaft dorsal aspect. ?Normal foot and ankle motion. ?Pulses cap refill and sensation are intact distally. ? ?Lab and Radiology Results ? ?X-ray images left foot obtained today personally and independently interpreted. ?No acute fractures are visible. ?No significant degenerative changes are visible. ?Await formal radiology review ? ? ? ?Impression and Recommendations:   ? ?Assessment and Plan: ?26 y.o. female with left dorsal forefoot pain occurring without injury.  This occurs while she is in the process of increasing her distance in preparation for marathon.  Her physical exam and history are concerning for a metatarsal stress fracture. ?Plan to treat with postop shoe and recheck in 3 weeks.  If not significantly improved we will likely proceed to MRI.  She has a  somewhat compressed timeline as her marathon is in August.  She cannot delay her training very much if she would like to be able to run in the marathon competitively. ? ?I also recommend calcium and vitamin D supplementation. ? ?PDMP not reviewed this encounter. ?Orders Placed This Encounter  ?Procedures  ? DG Foot Complete Left  ?  Standing Status:   Future  ?  Number of Occurrences:   1  ?  Standing Expiration Date:   09/28/2021  ?  Order Specific Question:   Reason for Exam (SYMPTOM  OR DIAGNOSIS REQUIRED)  ?  Answer:   L foot pain  ?  Order Specific Question:   Is patient pregnant?  ?  Answer:   No  ?  Order Specific Question:   Preferred imaging location?  ?  Answer:   [facility]  ? ?No orders of the defined types were placed in this encounter. ? ? ?Discussed warning signs or symptoms. Please see discharge instructions. Patient expresses understanding. ? ? ?The above documentation has been reviewed and is accurate and complete Lynne Leader, M.D. ? ?

## 2021-08-29 ENCOUNTER — Ambulatory Visit (INDEPENDENT_AMBULATORY_CARE_PROVIDER_SITE_OTHER): Payer: Managed Care, Other (non HMO)

## 2021-08-29 ENCOUNTER — Ambulatory Visit (INDEPENDENT_AMBULATORY_CARE_PROVIDER_SITE_OTHER): Payer: Managed Care, Other (non HMO) | Admitting: Family Medicine

## 2021-08-29 ENCOUNTER — Ambulatory Visit: Payer: Self-pay

## 2021-08-29 ENCOUNTER — Other Ambulatory Visit: Payer: Self-pay

## 2021-08-29 VITALS — BP 104/74 | HR 54 | Ht 67.0 in | Wt 146.2 lb

## 2021-08-29 DIAGNOSIS — M79672 Pain in left foot: Secondary | ICD-10-CM | POA: Diagnosis not present

## 2021-08-29 NOTE — Progress Notes (Signed)
Left foot x-ray shows no fractures.

## 2021-08-29 NOTE — Patient Instructions (Addendum)
Thank you for coming in today.  ? ?Vit D 1000-5000 units daily.  ? ?Calcium '1000mg'$  twice daily.  ? ?Please go to [facility] supply to get the post op shoe we talked about today. You may also be able to get it from [employer].   ? ?Recheck in 3 weeks.  ? ?No running.  ? 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 Exercise bike or swimming if it does not hurt your foot is ok.  ? ?Let me know if this is not working.  ?

## 2021-09-18 NOTE — Progress Notes (Signed)
? ?  I, Wendy Poet, LAT, ATC, am serving as scribe for Dr. Lynne Leader. ? ?Teresa Garrett is a 26 y.o. female who presents to [place] [facility] today for f/u of L dorsal foot pain concerning for possible L foot stress reaction as pt prepares to run her 1st marathon in August.  She was last seen by Dr.Omario Ander on 08/29/21 and was advised to purchase a post-op shoe.  She was also advised to begin calcium and Vit D supplementation.  Today, pt reports that her L foot is feeling much better.  She stopped wearing her post-op shoe.  She reports no pain x last week and feels she has full mobility.  Activity-wise, she's done the elliptical and did a 20 min jog on Saturday w no pain. ? ?Diagnostic testing: L foot XR- 08/29/21 ? ?Pertinent review of systems: Fevers or chills ? ?Relevant historical information: History of exertional compartment syndrome. ? ? ?Exam:  ?BP 104/64 (BP Location: Right Arm, Patient Position: Sitting, Cuff Size: Normal)   Pulse (!) 56   Ht '5\' 7"'$  (1.702 m)   Wt 146 lb 3.2 oz (66.3 kg)   SpO2 99%   BMI 22.90 kg/m?  ?General: Well Developed, well nourished, and in no acute distress.  ? ?MSK: Left foot normal-appearing nontender. ? ? ? ?Lab and Radiology Results ?DG Foot Complete Left ? ?Result Date: 08/29/2021 ?CLINICAL DATA:  Lateral and dorsal left foot pain and bruising for 1 day. No known injury. Noticed the evening after run. EXAM: LEFT FOOT - COMPLETE 3+ VIEW COMPARISON:  None. FINDINGS: Normal bone mineralization. Joint spaces are preserved. No acute fracture is seen. No dislocation. IMPRESSION: Normal left foot radiographs. Electronically Signed   By: Yvonne Kendall M.D.   On: 08/29/2021 09:48   ?I, Lynne Leader, personally (independently) visualized and performed the interpretation of the images attached in this note. ? ? ? ? ? ?Assessment and Plan: ?26 y.o. female with left dorsal midfoot to forefoot pain thought to be metatarsal stress reaction.  Doing well with rest and  immobilization.  She is already started to return to activity herself.  We will formalize to return to running program starting about 50% preinjury workload advancing 10 %/week.  Continue calcium and vitamin D.  Recheck back as needed.  Precautions reviewed. ? ?Total encounter time 20 minutes including face-to-face time with the patient and, reviewing past medical record, and charting on the date of service.   ?Treatment plan and options ? ? ? ?Discussed warning signs or symptoms. Please see discharge instructions. Patient expresses understanding. ? ? ?The above documentation has been reviewed and is accurate and complete Lynne Leader, M.D. ? ? ?

## 2021-09-19 ENCOUNTER — Encounter: Payer: Self-pay | Admitting: Family Medicine

## 2021-09-19 ENCOUNTER — Other Ambulatory Visit: Payer: Self-pay

## 2021-09-19 ENCOUNTER — Ambulatory Visit (INDEPENDENT_AMBULATORY_CARE_PROVIDER_SITE_OTHER): Payer: Managed Care, Other (non HMO) | Admitting: Family Medicine

## 2021-09-19 VITALS — BP 104/64 | HR 56 | Ht 67.0 in | Wt 146.2 lb

## 2021-09-19 DIAGNOSIS — M79672 Pain in left foot: Secondary | ICD-10-CM | POA: Diagnosis not present

## 2021-09-19 NOTE — Patient Instructions (Signed)
Good to see you today. ? ?Glad you're feeling better. ? ?Follow-up as needed. ?

## 2021-09-22 ENCOUNTER — Emergency Department (HOSPITAL_COMMUNITY)
Admission: EM | Admit: 2021-09-22 | Discharge: 2021-09-23 | Disposition: A | Discharge status: Home / Self Care | Payer: BC Managed Care – PPO | Attending: Emergency Medicine | Admitting: Emergency Medicine

## 2021-09-22 ENCOUNTER — Encounter (HOSPITAL_COMMUNITY): Payer: Self-pay | Admitting: Emergency Medicine

## 2021-09-22 ENCOUNTER — Other Ambulatory Visit: Payer: Self-pay

## 2021-09-22 DIAGNOSIS — F1010 Alcohol abuse, uncomplicated: Secondary | ICD-10-CM | POA: Insufficient documentation

## 2021-09-22 DIAGNOSIS — R400 Somnolence: Secondary | ICD-10-CM | POA: Diagnosis not present

## 2021-09-22 DIAGNOSIS — F410 Panic disorder [episodic paroxysmal anxiety] without agoraphobia: Secondary | ICD-10-CM | POA: Insufficient documentation

## 2021-09-22 DIAGNOSIS — F10920 Alcohol use, unspecified with intoxication, uncomplicated: Secondary | ICD-10-CM

## 2021-09-22 DIAGNOSIS — Y906 Blood alcohol level of 120-199 mg/100 ml: Secondary | ICD-10-CM | POA: Insufficient documentation

## 2021-09-22 NOTE — ED Triage Notes (Signed)
BIB EMS from bar where pt was having drinks with her husband, child, and friends.  Pt began experiencing panic attack. Husband states this happens sometimes, most recently about a month ago, where she had to be sedated as well.  EMS was unable to calm patient down and pt began to hit her head on the ground so she was given versed 5 mg IM.  Pt is responsive to pain on arrival.  VSS.   ?

## 2021-09-23 DIAGNOSIS — F410 Panic disorder [episodic paroxysmal anxiety] without agoraphobia: Secondary | ICD-10-CM | POA: Diagnosis not present

## 2021-09-23 LAB — BASIC METABOLIC PANEL
Anion gap: 7 (ref 5–15)
BUN: 15 mg/dL (ref 6–20)
CO2: 25 mmol/L (ref 22–32)
Calcium: 8.7 mg/dL — ABNORMAL LOW (ref 8.9–10.3)
Chloride: 109 mmol/L (ref 98–111)
Creatinine, Ser: 0.8 mg/dL (ref 0.44–1.00)
GFR, Estimated: >60 mL/min (ref >60)
Glucose, Bld: 96 mg/dL (ref 70–99)
Potassium: 4.2 mmol/L (ref 3.5–5.1)
Sodium: 141 mmol/L (ref 135–145)

## 2021-09-23 LAB — CBC WITH DIFFERENTIAL/PLATELET
Abs Immature Granulocytes: 0.05 10*3/uL (ref 0.00–0.07)
Basophils Absolute: 0 10*3/uL (ref 0.0–0.1)
Basophils Relative: 1 %
Eosinophils Absolute: 0.1 10*3/uL (ref 0.0–0.5)
Eosinophils Relative: 1 %
HCT: 35.6 % — ABNORMAL LOW (ref 36.0–46.0)
Hemoglobin: 11.9 g/dL — ABNORMAL LOW (ref 12.0–15.0)
Immature Granulocytes: 1 %
Lymphocytes Relative: 20 %
Lymphs Abs: 1.1 10*3/uL (ref 0.7–4.0)
MCH: 29.5 pg (ref 26.0–34.0)
MCHC: 33.4 g/dL (ref 30.0–36.0)
MCV: 88.3 fL (ref 80.0–100.0)
Monocytes Absolute: 0.3 10*3/uL (ref 0.1–1.0)
Monocytes Relative: 7 %
Neutro Abs: 3.6 10*3/uL (ref 1.7–7.7)
Neutrophils Relative %: 70 %
Platelets: 230 10*3/uL (ref 150–400)
RBC: 4.03 MIL/uL (ref 3.87–5.11)
RDW: 13.8 % (ref 11.5–15.5)
WBC: 5.2 10*3/uL (ref 4.0–10.5)
nRBC: 0 % (ref 0.0–0.2)

## 2021-09-23 LAB — ETHANOL: Alcohol, Ethyl (B): 196 mg/dL — ABNORMAL HIGH (ref <10)

## 2021-09-23 NOTE — ED Provider Notes (Signed)
?Post DEPT ?Provider Note ? ? ?CSN: [id number] ?Arrival date & time: 09/22/21  2321 ? ?  ? ?History ? ?Chief Complaint  ?Patient presents with  ? Panic Attack  ? ? ?Teresa Garrett is a 26 y.o. female. ? ?Patient is a 26 year old female with history of anxiety/panic disorder.  Patient presenting today for evaluation of suspected panic attack.  She was consuming alcohol with friends this evening when she began hyperventilating and became panicky.  EMS was called and paramedics were unable to calm her down.  Patient required Versed and is now resting comfortably.  Patient somnolent and difficult to arouse and adds little additional history.  Some history taken from husband who is present at bedside.  Patient was fine at dinner and fine throughout the day, then this episode came on acutely this evening. ? ?The history is provided by the patient and the spouse.  ? ?  ? ?Home Medications ?Prior to Admission medications   ?Medication Sig Start Date End Date Taking? Authorizing Provider  ?Prenatal Vit-Fe Fumarate-FA (PRENATAL PO) Take 1 tablet by mouth daily.    [provider]  ?sertraline (ZOLOFT) 100 MG tablet Take 100 mg by mouth daily. 06/27/20   [provider]  ?   ? ?Allergies    ?Apple juice   ? ?Review of Systems   ?Review of Systems  ?All other systems reviewed and are negative. ? ?Physical Exam ?Updated Vital Signs ?BP 111/67   Pulse 72   Temp 98.2 ?F (36.8 ?C) (Oral)   Resp 20   SpO2 99%  ?Physical Exam ?Vitals and nursing note reviewed.  ?Constitutional:   ?   General: She is not in acute distress. ?   Appearance: She is well-developed. She is not diaphoretic.  ?   Comments: Patient is somnolent and difficult to arouse, but is breathing comfortably and protecting her airway.  ?HENT:  ?   Head: Normocephalic and atraumatic.  ?Cardiovascular:  ?   Rate and Rhythm: Normal rate and regular rhythm.  ?   Heart sounds: No murmur heard. ?  No friction rub.  No gallop.  ?Pulmonary:  ?   Effort: Pulmonary effort is normal. No respiratory distress.  ?   Breath sounds: Normal breath sounds. No wheezing.  ?Abdominal:  ?   General: Bowel sounds are normal. There is no distension.  ?   Palpations: Abdomen is soft.  ?   Tenderness: There is no abdominal tenderness.  ?Musculoskeletal:     ?   General: Normal range of motion.  ?   Cervical back: Normal range of motion and neck supple.  ?Skin: ?   General: Skin is warm and dry.  ?Neurological:  ?   General: No focal deficit present.  ?   Mental Status: She is oriented to person, place, and time.  ? ? ?ED Results / Procedures / Treatments   ?Labs ?(all labs ordered are listed, but only abnormal results are displayed) ?Labs Reviewed - No data to display ? ?EKG ?None ? ?Radiology ?No results found. ? ?Procedures ?Procedures  ? ? ?Medications Ordered in ED ?Medications - No data to display ? ?ED Course/ Medical Decision Making/ A&P ? ?Patient brought here for apparent panic attack.  Patient was consuming alcohol this evening when she experienced a panic attack and began beating her head on the ground.  She was given Versed by EMS.  Patient's blood alcohol here is 195.  She has been observed for nearly 3  hours and is now awake, alert, and requesting to be discharged.  Laboratory studies are reassuring otherwise.  Patient seems appropriate for discharge. ? ?Final Clinical Impression(s) / ED Diagnoses ?Final diagnoses:  ?None  ? ? ?Rx / DC Orders ?ED Discharge Orders   ? ? None  ? ?  ? ? ?  ?Veryl Speak, MD ?09/23/21 0206 ? ?

## 2021-09-23 NOTE — Discharge Instructions (Signed)
Follow-up with primary doctor for any problems. 

## 2022-03-01 ENCOUNTER — Encounter: Payer: Self-pay | Admitting: Internal Medicine

## 2022-06-24 NOTE — L&D Delivery Note (Cosign Needed Addendum)
LABOR COURSE Patient arrived with vaginal bleeding concerning for placental abruption, 450 ml lost while in MAU.  Patient continued to have labor progression with trickle bleeding on L&D, baby EFM remained Cat I throughout labor.   Delivery Note Called to room by nurse, patient reporting increased pelvic pain and pressure. On SVE, patient progressed to complete at 2352 without augmentation. Head delivered ROA. Loose nuchal cord present, delivered through. Shoulder and body delivered in usual fashion. At 0007 a viable female was delivered via Vaginal, Spontaneous (Presentation: vertex  ).  Infant with spontaneous cry, placed on mother's abdomen, dried and stimulated. Due to infant distress, cord was then clamped x 2 without further delay, and cut by FOB. Cord blood and arterial gas drawn. Placenta delivered spontaneously with gentle cord traction. Significant blood and clots removed with fundal massage and single fundal sweep. Bleeding minimal following sweep. Fundus firm with massage, Pitocin and TXA. Labia, perineum, vagina, and cervix inspected with no lacerations.    APGAR: 6,9 ; weight 2330 g .   Cord: 3VC with the following complications: placental abruption.   Cord pH: 7.29  Anesthesia:  epidural Episiotomy: None Lacerations: none Suture Repair:  n/a Est. Blood Loss (mL): 500  Mom to postpartum.  Baby to NICU.  Paschal Dopp, MD 02/26/23  PGY-2 [facility] [place]  12:41 AM     GME ATTESTATION:  Evaluation and management procedures were performed by the [facility] Medicine Resident under my supervision. I was immediately available and gloved for direct supervision, assistance and direction throughout this encounter.  I also confirm that I have verified the information documented in the resident's note, and that I have also personally reperformed the pertinent components of the physical exam and all of the medical decision making activities.  I have also made any necessary editorial  changes.  Wyn Forster, MD OB Fellow, Faculty Practice [facility], [facility] 02/26/2023 9:33 AM

## 2022-08-12 ENCOUNTER — Encounter (HOSPITAL_COMMUNITY): Payer: Self-pay | Admitting: Family Medicine

## 2022-08-12 ENCOUNTER — Inpatient Hospital Stay (HOSPITAL_COMMUNITY)
Admission: AD | Admit: 2022-08-12 | Discharge: 2022-08-12 | Disposition: A | Discharge status: Home / Self Care | Payer: Managed Care, Other (non HMO) | Attending: Family Medicine | Admitting: Family Medicine

## 2022-08-12 DIAGNOSIS — Z3A01 Less than 8 weeks gestation of pregnancy: Secondary | ICD-10-CM | POA: Diagnosis not present

## 2022-08-12 DIAGNOSIS — O219 Vomiting of pregnancy, unspecified: Secondary | ICD-10-CM | POA: Diagnosis not present

## 2022-08-12 DIAGNOSIS — J45909 Unspecified asthma, uncomplicated: Secondary | ICD-10-CM | POA: Insufficient documentation

## 2022-08-12 LAB — URINALYSIS, ROUTINE W REFLEX MICROSCOPIC
Bilirubin Urine: NEGATIVE
Glucose, UA: NEGATIVE mg/dL
Hgb urine dipstick: NEGATIVE
Ketones, ur: NEGATIVE mg/dL
Leukocytes,Ua: NEGATIVE
Nitrite: NEGATIVE
Protein, ur: NEGATIVE mg/dL
Specific Gravity, Urine: >1.03 — ABNORMAL HIGH (ref 1.005–1.030)
pH: 6 (ref 5.0–8.0)

## 2022-08-12 LAB — POCT PREGNANCY, URINE: Preg Test, Ur: POSITIVE — AB

## 2022-08-12 MED ORDER — FAMOTIDINE IN NACL 20-0.9 MG/50ML-% IV SOLN
20.0000 mg | Freq: Once | INTRAVENOUS | Status: AC
Start: 2022-08-12 — End: 2022-08-12
  Administered 2022-08-12: 20 mg via INTRAVENOUS
  Filled 2022-08-12: qty 50

## 2022-08-12 MED ORDER — SODIUM CHLORIDE 0.9 % IV SOLN
25.0000 mg | Freq: Once | INTRAVENOUS | Status: AC
  Administered 2022-08-12: 25 mg via INTRAVENOUS
  Filled 2022-08-12: qty 1

## 2022-08-12 MED ORDER — PROMETHAZINE HCL 25 MG PO TABS: 25.0000 mg | ORAL_TABLET | Freq: Four times a day (QID) | ORAL | 1 refills | Status: DC | PRN

## 2022-08-12 MED ORDER — LACTATED RINGERS IV BOLUS
1000.0000 mL | Freq: Once | INTRAVENOUS | Status: AC
  Administered 2022-08-12: 1000 mL via INTRAVENOUS

## 2022-08-12 NOTE — MAU Provider Note (Signed)
History     EI:[id number]  Arrival date and time: 08/12/22 1628    Chief Complaint  Patient presents with   Emesis   Nausea     HPI Teresa Garrett is a 27 y.o. at 49w3dwho presents for nausea & vomiting. Symptoms started over a week ago. States she vomits 2-3 times per day. Has vomited twice today. Able to tolerate rice & sparkling water. Has been taking unisom & B6 without relief. Denies fever, abdominal pain, dysuria, diarrhea, or vaginal bleeding. [facility]   OB History     Gravida  3   Para  1   Term  1   Preterm      AB  1   Living  1      SAB  1   IAB      Ectopic      Multiple      Live Births  1           Past Medical History:  Diagnosis Date   Asthma    as a child   [facility]    Complication of anesthesia    Concussion    Family history of breast cancer    Headache    migraines-    PONV (postoperative nausea and vomiting)     Past Surgical History:  Procedure Laterality Date   FASCIOTOMY Right 07/03/2018   Procedure: ANTERIOR AND LATERAL COMPARTMENT RELEASE RIGHT LEG;  Surgeon: DNewt Minion MD;  [place] [facility]  Service: Orthopedics;  Laterality: Right;   WISDOM TOOTH EXTRACTION     "gas"    Family History  Problem Relation Age of Onset   Breast cancer Other        dx 335s  Colon cancer Neg Hx    Esophageal cancer Neg Hx    Pancreatic cancer Neg Hx    Stomach cancer Neg Hx    Liver disease Neg Hx     Allergies  Allergen Reactions   Apple Juice Itching and Nausea Only    No current facility-administered medications on file prior to encounter.   Current Outpatient Medications on File Prior to Encounter  Medication Sig Dispense Refill   sertraline (ZOLOFT) 100 MG tablet Take 100 mg by mouth daily.     traZODone (DESYREL) 50 MG tablet Take by mouth.       ROS Pertinent positives and negative per HPI, all others reviewed and negative  Physical  Exam   BP (!) 110/59 (BP Location: Right Arm)   Pulse (!) 58   Temp 98.3 F (36.8 C) (Oral)   Resp 18   Ht '5\' 7"'$  (1.702 m)   Wt 63.1 kg   LMP 06/28/2022 (Exact Date)   SpO2 100%   BMI 21.80 kg/m   Patient Vitals for the past 24 hrs:  BP Temp Temp src Pulse Resp SpO2 Height Weight  08/12/22 1956 (!) 110/59 -- -- (!) 58 -- -- -- --  08/12/22 1709 111/61 -- -- (!) 55 -- -- -- --  08/12/22 1647 111/64 98.3 F (36.8 C) Oral 65 18 100 % '5\' 7"'$  (1.702 m) 63.1 kg    Physical Exam Vitals and nursing note reviewed.  Constitutional:      General: She is not in acute distress.    Appearance: She is well-developed. She is not ill-appearing.  HENT:     Head: Normocephalic and atraumatic.  Eyes:  General: No scleral icterus.       Right eye: No discharge.        Left eye: No discharge.     Conjunctiva/sclera: Conjunctivae normal.  Pulmonary:     Effort: Pulmonary effort is normal. No respiratory distress.  Neurological:     General: No focal deficit present.     Mental Status: She is alert.  Psychiatric:        Mood and Affect: Mood normal.        Behavior: Behavior normal.       Labs Results for orders placed or performed during the hospital encounter of 08/12/22 (from the past 24 hour(s))  Urinalysis, Routine w reflex microscopic -Urine, Clean Catch     Status: Abnormal   Collection Time: 08/12/22  4:55 PM  Result Value Ref Range   Color, Urine YELLOW YELLOW   APPearance CLEAR CLEAR   Specific Gravity, Urine >1.030 (H) 1.005 - 1.030   pH 6.0 5.0 - 8.0   Glucose, UA NEGATIVE NEGATIVE mg/dL   Hgb urine dipstick NEGATIVE NEGATIVE   Bilirubin Urine NEGATIVE NEGATIVE   Ketones, ur NEGATIVE NEGATIVE mg/dL   Protein, ur NEGATIVE NEGATIVE mg/dL   Nitrite NEGATIVE NEGATIVE   Leukocytes,Ua NEGATIVE NEGATIVE  Pregnancy, urine POC     Status: Abnormal   Collection Time: 08/12/22  4:56 PM  Result Value Ref Range   Preg Test, Ur POSITIVE (A) NEGATIVE    Imaging No results  found.  MAU Course  Procedures Lab Orders         Urinalysis, Routine w reflex microscopic -Urine, Clean Catch         Pregnancy, urine POC    Meds ordered this encounter  Medications   lactated ringers bolus 1,000 mL   promethazine (PHENERGAN) 25 mg in sodium chloride 0.9 % 50 mL IVPB   famotidine (PEPCID) IVPB 20 mg premix   promethazine (PHENERGAN) 25 MG tablet    Sig: Take 1 tablet (25 mg total) by mouth every 6 (six) hours as needed for nausea or vomiting.    Dispense:  30 tablet    Refill:  1    Order Specific Question:   Supervising Provider    Answer:   CONSTANT, PEGGY [[id number]]   Imaging Orders  No imaging studies ordered today    MDM Treatment in MAU included IV fluids, phenergan, & pepcid. Patient reports improvement in symptoms & tolerating POs. Stable for discharge home.  Assessment and Plan   1. Nausea and vomiting during pregnancy prior to [redacted] weeks gestation   2. [redacted] weeks gestation of pregnancy    Discussed reasons to return to MAU Rx Metcalfe, NP 08/12/22 8:05 PM

## 2022-08-12 NOTE — MAU Note (Signed)
Teresa Garrett is a 27 y.o. at Unknown here in MAU reporting: extremely nauseous. Throwing up a lot. No energy. Preg confirmed at [facility]. No visits or meds yet.  Pain/burning in upper abd. Constant. No bleeding. LMP: 1/5 Onset of complaint: ~10days ago Pain score: 7 Vitals:   08/12/22 1647  BP: 111/64  Pulse: 65  Resp: 18  Temp: 98.3 F (36.8 C)  SpO2: 100%      Lab orders placed from triage:  UA/UPT

## 2022-08-23 LAB — HEPATITIS C ANTIBODY: HCV Ab: NEGATIVE

## 2022-08-23 LAB — OB RESULTS CONSOLE GC/CHLAMYDIA
Chlamydia: NEGATIVE
Neisseria Gonorrhea: NEGATIVE

## 2022-08-23 LAB — OB RESULTS CONSOLE RUBELLA ANTIBODY, IGM: Rubella: IMMUNE

## 2022-08-23 LAB — OB RESULTS CONSOLE HIV ANTIBODY (ROUTINE TESTING): HIV: NONREACTIVE

## 2022-08-23 LAB — OB RESULTS CONSOLE HEPATITIS B SURFACE ANTIGEN: Hepatitis B Surface Ag: NEGATIVE

## 2022-10-26 IMAGING — DX DG FOOT COMPLETE 3+V*L*
3 series · 3 of 3 positions shown · non-contrast
Comparison: None.

CLINICAL DATA: Lateral and dorsal left foot pain and bruising for 1
day. No known injury. Noticed the evening after run.

EXAM:
LEFT FOOT - COMPLETE 3+ VIEW

[foot ap]
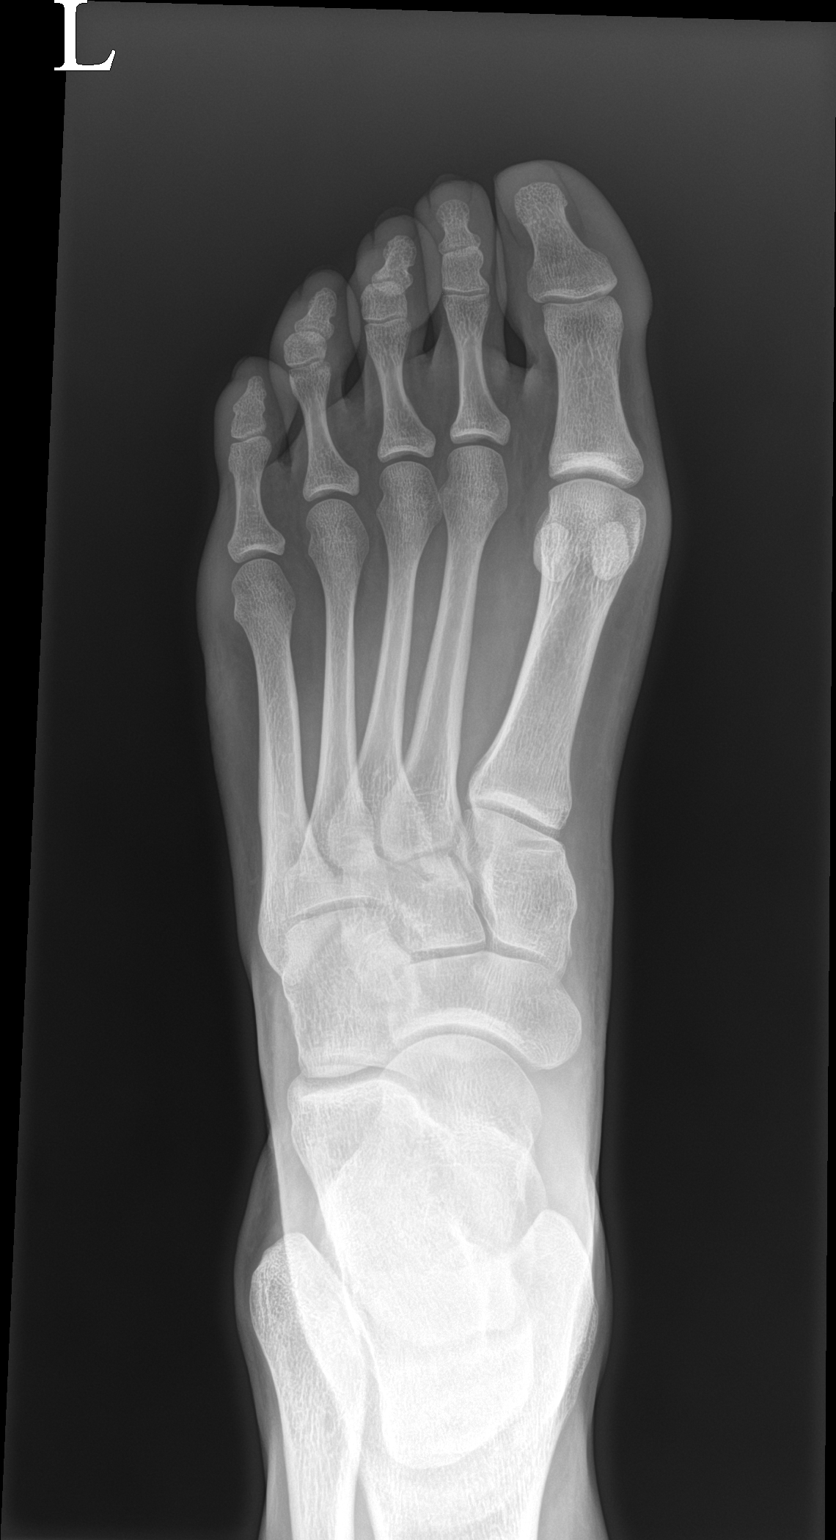

[foot obl]
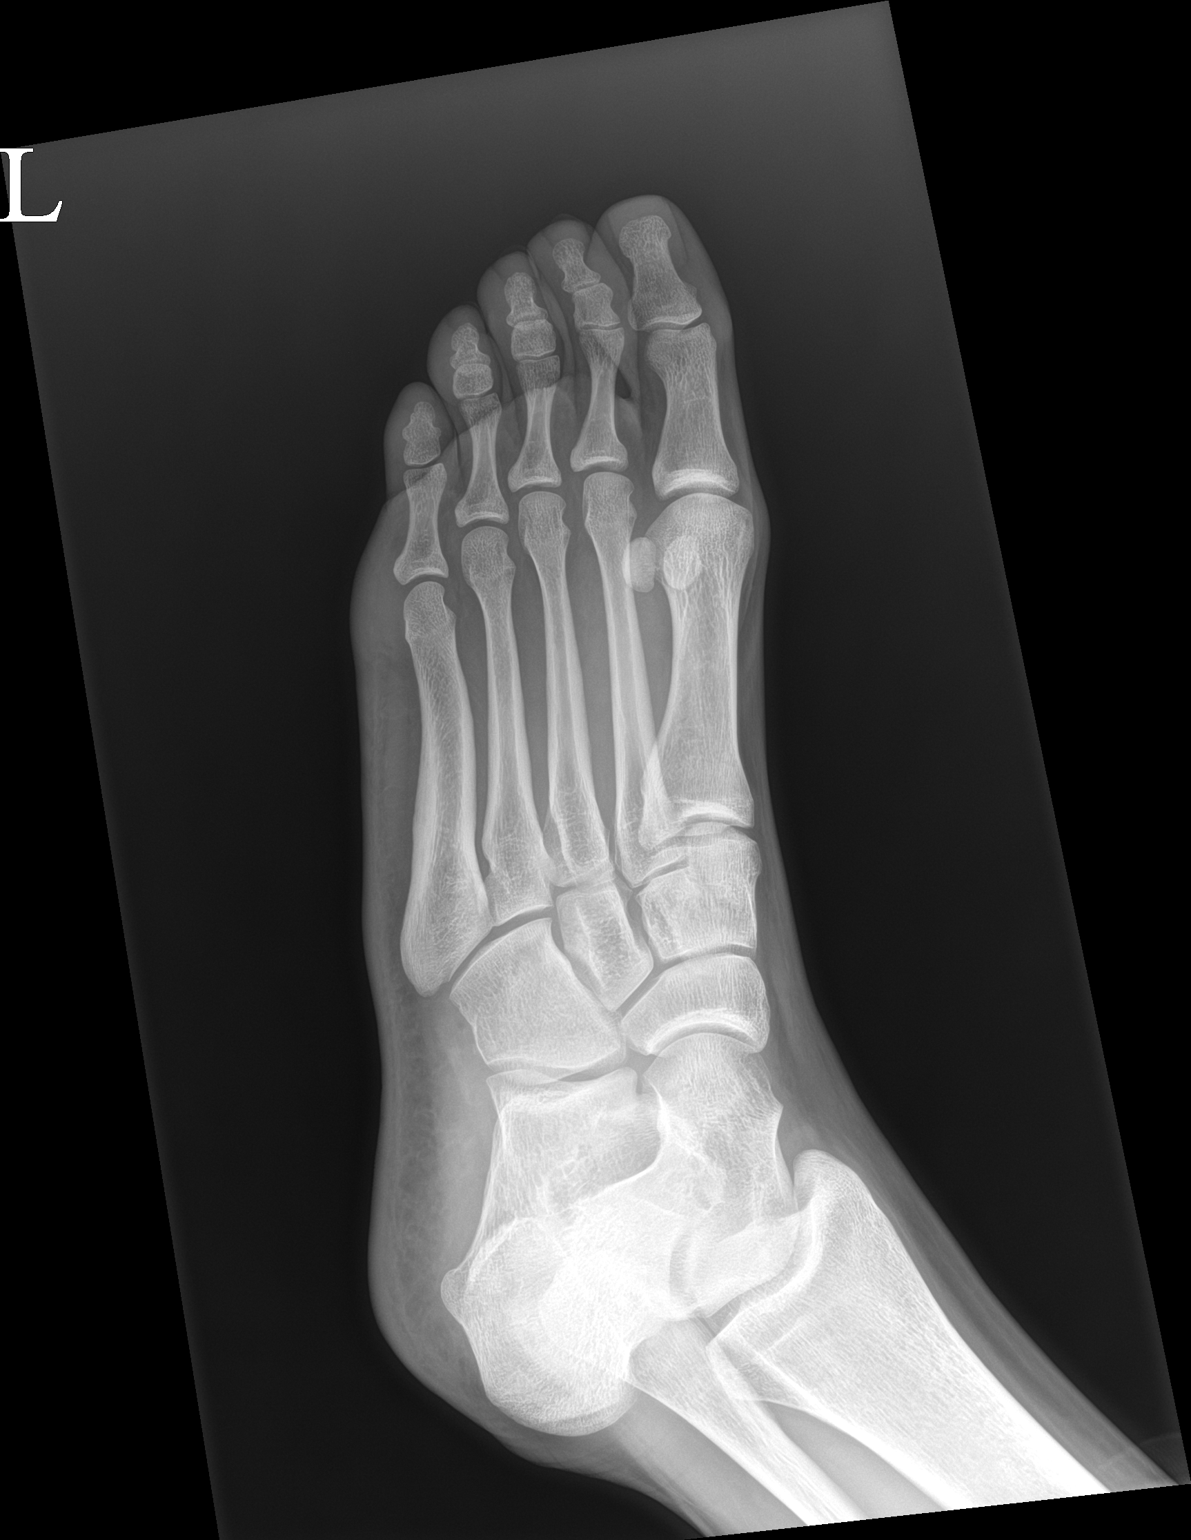

[foot lat]
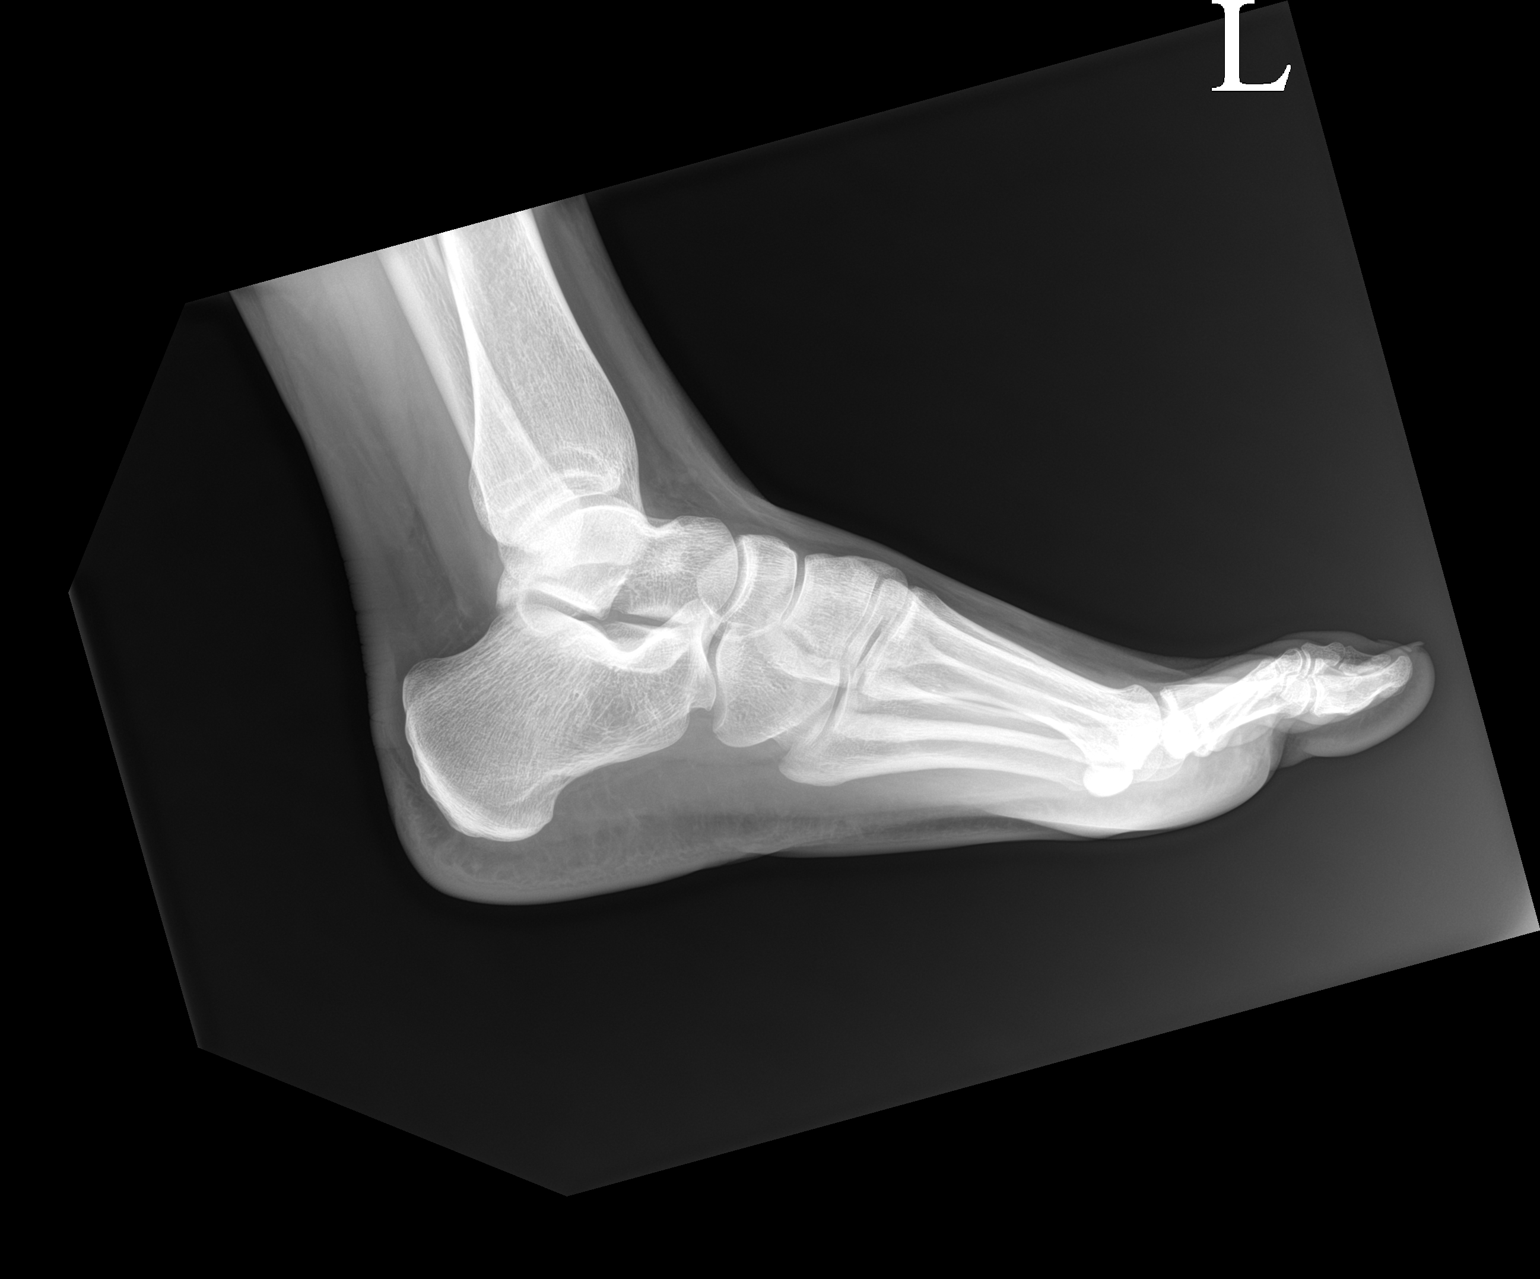

[3 of 3 positions shown; findings below may reference images not displayed]

FINDINGS: Normal bone mineralization. Joint spaces are preserved. No acute
fracture is seen. No dislocation.
IMPRESSION: Normal left foot radiographs.

## 2022-11-06 ENCOUNTER — Encounter (HOSPITAL_COMMUNITY): Payer: Self-pay | Admitting: Obstetrics & Gynecology

## 2022-11-06 ENCOUNTER — Ambulatory Visit: Payer: Self-pay | Admitting: *Deleted

## 2022-11-06 ENCOUNTER — Inpatient Hospital Stay (HOSPITAL_COMMUNITY)
Admission: AD | Admit: 2022-11-06 | Discharge: 2022-11-06 | Disposition: A | Discharge status: Home / Self Care | Payer: BC Managed Care – PPO | Attending: Obstetrics & Gynecology | Admitting: Obstetrics & Gynecology

## 2022-11-06 DIAGNOSIS — R112 Nausea with vomiting, unspecified: Secondary | ICD-10-CM

## 2022-11-06 DIAGNOSIS — R0781 Pleurodynia: Secondary | ICD-10-CM | POA: Insufficient documentation

## 2022-11-06 DIAGNOSIS — Z3A18 18 weeks gestation of pregnancy: Secondary | ICD-10-CM | POA: Insufficient documentation

## 2022-11-06 DIAGNOSIS — O26892 Other specified pregnancy related conditions, second trimester: Secondary | ICD-10-CM | POA: Diagnosis not present

## 2022-11-06 DIAGNOSIS — R1030 Lower abdominal pain, unspecified: Secondary | ICD-10-CM | POA: Insufficient documentation

## 2022-11-06 DIAGNOSIS — O210 Mild hyperemesis gravidarum: Secondary | ICD-10-CM | POA: Diagnosis present

## 2022-11-06 DIAGNOSIS — E860 Dehydration: Secondary | ICD-10-CM

## 2022-11-06 DIAGNOSIS — O211 Hyperemesis gravidarum with metabolic disturbance: Secondary | ICD-10-CM | POA: Diagnosis not present

## 2022-11-06 LAB — COMPREHENSIVE METABOLIC PANEL
ALT: 14 U/L (ref 0–44)
AST: 19 U/L (ref 15–41)
Albumin: 3.1 g/dL — ABNORMAL LOW (ref 3.5–5.0)
Alkaline Phosphatase: 48 U/L (ref 38–126)
Anion gap: 11 (ref 5–15)
BUN: 8 mg/dL (ref 6–20)
CO2: 20 mmol/L — ABNORMAL LOW (ref 22–32)
Calcium: 8.9 mg/dL (ref 8.9–10.3)
Chloride: 100 mmol/L (ref 98–111)
Creatinine, Ser: 0.78 mg/dL (ref 0.44–1.00)
GFR, Estimated: >60 mL/min (ref >60)
Glucose, Bld: 81 mg/dL (ref 70–99)
Potassium: 3.6 mmol/L (ref 3.5–5.1)
Sodium: 131 mmol/L — ABNORMAL LOW (ref 135–145)
Total Bilirubin: 0.6 mg/dL (ref 0.3–1.2)
Total Protein: 6.1 g/dL — ABNORMAL LOW (ref 6.5–8.1)

## 2022-11-06 LAB — CBC
HCT: 36.2 % (ref 36.0–46.0)
Hemoglobin: 12 g/dL (ref 12.0–15.0)
MCH: 29.4 pg (ref 26.0–34.0)
MCHC: 33.1 g/dL (ref 30.0–36.0)
MCV: 88.7 fL (ref 80.0–100.0)
Platelets: 176 10*3/uL (ref 150–400)
RBC: 4.08 MIL/uL (ref 3.87–5.11)
RDW: 12.8 % (ref 11.5–15.5)
WBC: 6.2 10*3/uL (ref 4.0–10.5)
nRBC: 0 % (ref 0.0–0.2)

## 2022-11-06 LAB — URINALYSIS, ROUTINE W REFLEX MICROSCOPIC
Bilirubin Urine: NEGATIVE
Glucose, UA: NEGATIVE mg/dL
Hgb urine dipstick: NEGATIVE
Ketones, ur: 80 mg/dL — AB
Leukocytes,Ua: NEGATIVE
Nitrite: NEGATIVE
Protein, ur: NEGATIVE mg/dL
Specific Gravity, Urine: 1.025 (ref 1.005–1.030)
pH: 5 (ref 5.0–8.0)

## 2022-11-06 MED ORDER — SODIUM CHLORIDE 0.9 % IV SOLN
12.5000 mg | Freq: Once | INTRAVENOUS | Status: AC
  Administered 2022-11-06: 12.5 mg via INTRAVENOUS
  Filled 2022-11-06: qty 12.5

## 2022-11-06 MED ORDER — ONDANSETRON 4 MG PO TBDP: 4.0000 mg | ORAL_TABLET | Freq: Four times a day (QID) | ORAL | 0 refills | Status: DC | PRN

## 2022-11-06 MED ORDER — LACTATED RINGERS IV SOLN: Freq: Once | INTRAVENOUS | Status: AC

## 2022-11-06 NOTE — Telephone Encounter (Signed)
Summary: stomach discomfort   The patient shares that they are [redacted] weeks pregnant and experiencing nausea and stomach discomfort  The patient is unable to keep food and liquids down  The patient would like to speak with a member of clinical staff when possible         Chief Complaint: abdominal pain , fever [redacted] weeks pregnant Symptoms: left side abdominal pain coughing causes cramping. N/V fever 101. Unable to tolerate fluids or food. Lightheaded "woozy" trying to stand.  Frequency: na  Pertinent Negatives: Patient denies na  Disposition: [x] ED /[] Urgent Care (no appt availability in office) / [] Appointment(In office/virtual)/ []  [facility]/ [] Home Care/ [] Refused Recommended Disposition /[] [facility]/ []  Follow-up with PCP Additional Notes:   Recommended to go to ED now and call 911 if sx worsen . Recommended to contact OBGYN when able to do so to notify of sx.   Reason for Disposition  [1] Drinking very little AND [2] dehydration suspected (e.g., no urine > 12 hours, very dry mouth, very lightheaded)  Answer Assessment - Initial Assessment Questions 1. TEMPERATURE: "What is the most recent temperature?"  "How was it measured?"      101 2. ONSET: "When did the fever start?"      Na  3. CHILLS: "Do you have chills?" If Yes, ask: "How bad are they?"  (e.g., none, mild, moderate, severe)   - NONE: No chills.   - MILD: Feeling cold.   - MODERATE: Feeling very cold, some shivering (feels better under a thick blanket).   - SEVERE: Feeling extremely cold with shaking chills (general body shaking, rigors; even under a thick blanket).      Abdominal cramping  4. CAUSE: If there are no symptoms, ask: "What do you think is causing the fever?"      Na  5. CONTACTS: "Does anyone else in the family have an infection?"     Na  6. TREATMENT: "What have you done so far to treat this fever?" (e.g., medicines)     na 7. IMMUNOCOMPROMISE: "Do you have of the following:  diabetes, HIV positive, splenectomy, chronic steroid treatment, transplant patient, etc."     na 8. OTHER SYMPTOMS: "Do you have any other symptoms?" (e.g., abdomen pain, cough, decreased fetal movement, diarrhea, headache, leaking fluid or concern water broke, sore throat, urination pain, vaginal bleeding)     N/V, unable to tolerate fluids or food. Left side pain 9. EDD: "What date are you expecting to deliver?     Unsure . [redacted] weeks pregnant  10. TRAVEL: "Have you traveled out of the country in the last month?" (e.g., travel history, exposures)       na  Protocols used: Pregnancy - Fever-A-AH

## 2022-11-06 NOTE — MAU Provider Note (Signed)
Chief Complaint:  Abdominal Pain, Emesis, and Nausea   Event Date/Time   First Provider Initiated Contact with Patient 11/06/22 2101     HPI: Teresa Garrett is a 27 y.o. G3P1011 at 46w5dwho presents to maternity admissions reporting nausea and vomiting since yesterday.  No diarrhea.  Sees OB in [place] but has not called them.  Has not tried anything for this. Has pain in RUQ which comes and goes.  She denies LOF, vaginal bleeding, urinary symptoms, h/a, dizziness, diarrhea, constipation or fever/chills.  Abdominal Pain This is a new problem. The current episode started yesterday. The problem occurs intermittently. The pain is located in the RUQ. The quality of the pain is cramping. The abdominal pain does not radiate. Associated symptoms include nausea and vomiting. Pertinent negatives include no constipation, diarrhea, dysuria, fever, frequency or myalgias. Nothing aggravates the pain. The pain is relieved by Nothing. She has tried nothing for the symptoms.  Emesis  This is a new problem. The current episode started yesterday. The problem has been unchanged. Associated symptoms include abdominal pain. Pertinent negatives include no diarrhea, fever or myalgias. She has tried nothing for the symptoms.   RN Note: Teresa Garrett is a 27 y.o. at [redacted]w[redacted]d here in MAU reporting: has not been able to keep anything down for 24 hours, has vomited 3 times today. Has not taken any nausea medication, gets care at [facility] in [place].  Reports rib pain and left lower abdominal cramping. Has not taken anything for pain d/t nausea. Denies vaginal bleeding  Past Medical History: noncontributory  Past obstetric history: OB History  Gravida Para Term Preterm AB Living  3 1 1   1 1   SAB IAB Ectopic Multiple Live Births  1       1    # Outcome Date GA Lbr Len/2nd Weight Sex Delivery Anes PTL Lv  3 Current           2 SAB           1 Term      Vag-Spont   LIV    Past Surgical History: Past  Surgical History:  Procedure Laterality Date   FASCIOTOMY Right 07/03/2018   Procedure: ANTERIOR AND LATERAL COMPARTMENT RELEASE RIGHT LEG;  Surgeon: Nadara Mustard, MD;  Location: [facility] OR;  Service: Orthopedics;  Laterality: Right;   WISDOM TOOTH EXTRACTION     "gas"    Family History: Family History  Problem Relation Age of Onset   Breast cancer Other        dx 30s   Colon cancer Neg Hx    Esophageal cancer Neg Hx    Pancreatic cancer Neg Hx    Stomach cancer Neg Hx    Liver disease Neg Hx     Social History: Social History   Tobacco Use   Smoking status: Never   Smokeless tobacco: Never  Vaping Use   Vaping Use: Never used  Substance Use Topics   Alcohol use: No   Drug use: No    Allergies:  Allergies  Allergen Reactions   Apple Juice Itching and Nausea Only    Meds:  Medications Prior to Admission  Medication Sig Dispense Refill Last Dose   Prenatal Vit-Fe Fumarate-FA (MULTIVITAMIN-PRENATAL) 27-0.8 MG TABS tablet Take 1 tablet by mouth daily at 12 noon.   11/06/2022   sertraline (ZOLOFT) 100 MG tablet Take 100 mg by mouth daily.   Past Week   promethazine (PHENERGAN) 25 MG tablet Take 1  tablet (25 mg total) by mouth every 6 (six) hours as needed for nausea or vomiting. 30 tablet 1 More than a month   traZODone (DESYREL) 50 MG tablet Take by mouth.   More than a month    I have reviewed patient's Past Medical Hx, Surgical Hx, Family Hx, Social Hx, medications and allergies.   ROS:  Review of Systems  Constitutional:  Negative for fever.  Gastrointestinal:  Positive for abdominal pain, nausea and vomiting. Negative for constipation and diarrhea.  Genitourinary:  Negative for dysuria and frequency.  Musculoskeletal:  Negative for myalgias.   Other systems negative  Physical Exam  Patient Vitals for the past 24 hrs:  BP Temp Temp src Pulse Resp SpO2 Height Weight  11/06/22 2045 (!) 116/58 -- -- 75 -- -- -- --  11/06/22 2005 (!) 111/59 99.7 F (37.6 C) Oral  94 18 100 % 5\' 7"  (1.702 m) 65.6 kg   Constitutional: Well-developed, well-nourished female in no acute distress.  Cardiovascular: normal rate  Respiratory: normal effort GI: Abd soft, non-tender, gravid appropriate for gestational age.   No rebound or guarding. MS: Extremities nontender, no edema, normal ROM Neurologic: Alert and oriented x 4.  GU: Neg CVAT.   FHT:   150  Labs: Results for orders placed or performed during the hospital encounter of 11/06/22 (from the past 24 hour(s))  Urinalysis, Routine w reflex microscopic -Urine, Clean Catch     Status: Abnormal   Collection Time: 11/06/22  7:53 PM  Result Value Ref Range   Color, Urine YELLOW YELLOW   APPearance HAZY (A) CLEAR   Specific Gravity, Urine 1.025 1.005 - 1.030   pH 5.0 5.0 - 8.0   Glucose, UA NEGATIVE NEGATIVE mg/dL   Hgb urine dipstick NEGATIVE NEGATIVE   Bilirubin Urine NEGATIVE NEGATIVE   Ketones, ur 80 (A) NEGATIVE mg/dL   Protein, ur NEGATIVE NEGATIVE mg/dL   Nitrite NEGATIVE NEGATIVE   Leukocytes,Ua NEGATIVE NEGATIVE  CBC     Status: None   Collection Time: 11/06/22  9:23 PM  Result Value Ref Range   WBC 6.2 4.0 - 10.5 K/uL   RBC 4.08 3.87 - 5.11 MIL/uL   Hemoglobin 12.0 12.0 - 15.0 g/dL   HCT 16.1 09.6 - 04.5 %   MCV 88.7 80.0 - 100.0 fL   MCH 29.4 26.0 - 34.0 pg   MCHC 33.1 30.0 - 36.0 g/dL   RDW 40.9 81.1 - 91.4 %   Platelets 176 150 - 400 K/uL   nRBC 0.0 0.0 - 0.2 %  Comprehensive metabolic panel     Status: Abnormal   Collection Time: 11/06/22  9:23 PM  Result Value Ref Range   Sodium 131 (L) 135 - 145 mmol/L   Potassium 3.6 3.5 - 5.1 mmol/L   Chloride 100 98 - 111 mmol/L   CO2 20 (L) 22 - 32 mmol/L   Glucose, Bld 81 70 - 99 mg/dL   BUN 8 6 - 20 mg/dL   Creatinine, Ser 7.82 0.44 - 1.00 mg/dL   Calcium 8.9 8.9 - 95.6 mg/dL   Total Protein 6.1 (L) 6.5 - 8.1 g/dL   Albumin 3.1 (L) 3.5 - 5.0 g/dL   AST 19 15 - 41 U/L   ALT 14 0 - 44 U/L   Alkaline Phosphatase 48 38 - 126 U/L   Total  Bilirubin 0.6 0.3 - 1.2 mg/dL   GFR, Estimated >21 >30 mL/min   Anion gap 11 5 - 15  Imaging:  No results found.  MAU Course/MDM: I have reviewed the triage vital signs and the nursing notes.   Pertinent labs & imaging results that were available during my care of the patient were reviewed by me and considered in my medical decision making (see chart for details).      I have reviewed her medical records including past results, notes and treatments.   I have ordered labs and reviewed results. No leukocytosis or hypokalemia.  Moderate dehydration IV fluids ordered  Treatments in MAU included Phenergan and IV hydration.  Felt better after these treatements and was able to tolerate PO intake  Assessment: Single IUP at [redacted]w[redacted]d Nausea and vomiting Dehydration  Plan: Discharge home Rx Zofran for prn use for nausea Advance diet as tolerated Encouraged to return if she develops worsening of symptoms, increase in pain, fever, or other concerning symptoms.   Follow up in Office for prenatal visits and recheck  Pt stable at time of discharge.  Wynelle Bourgeois CNM, MSN Certified Nurse-Midwife 11/06/2022 9:01 PM

## 2022-11-06 NOTE — MAU Note (Addendum)
..  Teresa Garrett is a 27 y.o. at 105w5d here in MAU reporting: has not been able to keep anything down for 24 hours, has vomited 3 times today. Has not taken any nausea medication, gets care at [facility] in [place].  Reports rib pain and left lower abdominal cramping. Has not taken anything for pain d/t nausea. Denies vaginal bleeding.   Pain score: 6/10  Vitals:   11/06/22 2005  BP: (!) 111/59  Pulse: 94  Resp: 18  Temp: 99.7 F (37.6 C)  SpO2: 100%     FHT:161 Lab orders placed from triage:  UA

## 2023-01-17 LAB — OB RESULTS CONSOLE RPR: RPR: NONREACTIVE

## 2023-02-25 ENCOUNTER — Inpatient Hospital Stay (HOSPITAL_BASED_OUTPATIENT_CLINIC_OR_DEPARTMENT_OTHER): Payer: Medicaid Other

## 2023-02-25 ENCOUNTER — Inpatient Hospital Stay (HOSPITAL_COMMUNITY): Payer: Medicaid Other | Admitting: Anesthesiology

## 2023-02-25 ENCOUNTER — Inpatient Hospital Stay (HOSPITAL_COMMUNITY)
Admission: AD | Admit: 2023-02-25 | Discharge: 2023-02-28 | DRG: 805 | Disposition: A | Discharge status: Home / Self Care | Payer: Medicaid Other | Attending: Obstetrics & Gynecology | Admitting: Obstetrics & Gynecology

## 2023-02-25 ENCOUNTER — Encounter (HOSPITAL_COMMUNITY): Payer: Self-pay | Admitting: Obstetrics & Gynecology

## 2023-02-25 ENCOUNTER — Other Ambulatory Visit: Payer: Self-pay

## 2023-02-25 DIAGNOSIS — O4593 Premature separation of placenta, unspecified, third trimester: Principal | ICD-10-CM | POA: Diagnosis present

## 2023-02-25 DIAGNOSIS — O4693 Antepartum hemorrhage, unspecified, third trimester: Secondary | ICD-10-CM

## 2023-02-25 DIAGNOSIS — Z3A34 34 weeks gestation of pregnancy: Principal | ICD-10-CM

## 2023-02-25 DIAGNOSIS — D500 Iron deficiency anemia secondary to blood loss (chronic): Secondary | ICD-10-CM | POA: Diagnosis not present

## 2023-02-25 DIAGNOSIS — Z79899 Other long term (current) drug therapy: Secondary | ICD-10-CM | POA: Diagnosis not present

## 2023-02-25 DIAGNOSIS — O9081 Anemia of the puerperium: Secondary | ICD-10-CM | POA: Diagnosis not present

## 2023-02-25 LAB — CBC
HCT: 25.4 % — ABNORMAL LOW (ref 36.0–46.0)
Hemoglobin: 8.5 g/dL — ABNORMAL LOW (ref 12.0–15.0)
MCH: 28.1 pg (ref 26.0–34.0)
MCHC: 33.5 g/dL (ref 30.0–36.0)
MCV: 83.8 fL (ref 80.0–100.0)
Platelets: 183 10*3/uL (ref 150–400)
RBC: 3.03 MIL/uL — ABNORMAL LOW (ref 3.87–5.11)
RDW: 11.8 % (ref 11.5–15.5)
WBC: 14.2 10*3/uL — ABNORMAL HIGH (ref 4.0–10.5)
nRBC: 0 % (ref 0.0–0.2)

## 2023-02-25 LAB — POCT FERN TEST: POCT Fern Test: NEGATIVE

## 2023-02-25 LAB — TYPE AND SCREEN
ABO/RH(D): O POS
Antibody Screen: NEGATIVE

## 2023-02-25 LAB — GROUP B STREP BY PCR: Group B strep by PCR: NEGATIVE

## 2023-02-25 MED ORDER — DIPHENHYDRAMINE HCL 50 MG/ML IJ SOLN: 12.5000 mg | INTRAMUSCULAR | Status: DC | PRN

## 2023-02-25 MED ORDER — LACTATED RINGERS IV SOLN: INTRAVENOUS | Status: DC

## 2023-02-25 MED ORDER — PHENYLEPHRINE 80 MCG/ML (10ML) SYRINGE FOR IV PUSH (FOR BLOOD PRESSURE SUPPORT): 80.0000 ug | PREFILLED_SYRINGE | INTRAVENOUS | Status: DC | PRN

## 2023-02-25 MED ORDER — LACTATED RINGERS IV SOLN: 500.0000 mL | INTRAVENOUS | Status: DC | PRN

## 2023-02-25 MED ORDER — LACTATED RINGERS IV SOLN
500.0000 mL | Freq: Once | INTRAVENOUS | Status: AC
  Administered 2023-02-25: 500 mL via INTRAVENOUS

## 2023-02-25 MED ORDER — FENTANYL-BUPIVACAINE-NACL 0.5-0.125-0.9 MG/250ML-% EP SOLN
12.0000 mL/h | EPIDURAL | Status: DC | PRN
  Administered 2023-02-25: 12 mL/h via EPIDURAL
  Filled 2023-02-25: qty 250

## 2023-02-25 MED ORDER — EPHEDRINE 5 MG/ML INJ: 10.0000 mg | INTRAVENOUS | Status: DC | PRN

## 2023-02-25 MED ORDER — OXYCODONE-ACETAMINOPHEN 5-325 MG PO TABS: 2.0000 | ORAL_TABLET | ORAL | Status: DC | PRN

## 2023-02-25 MED ORDER — OXYTOCIN-SODIUM CHLORIDE 30-0.9 UT/500ML-% IV SOLN
2.5000 [IU]/h | INTRAVENOUS | Status: DC
  Administered 2023-02-26: 2.5 [IU]/h via INTRAVENOUS
  Filled 2023-02-25: qty 500

## 2023-02-25 MED ORDER — OXYTOCIN BOLUS FROM INFUSION
333.0000 mL | Freq: Once | INTRAVENOUS | Status: AC
  Administered 2023-02-26: 333 mL via INTRAVENOUS

## 2023-02-25 MED ORDER — OXYCODONE-ACETAMINOPHEN 5-325 MG PO TABS: 1.0000 | ORAL_TABLET | ORAL | Status: DC | PRN

## 2023-02-25 MED ORDER — SOD CITRATE-CITRIC ACID 500-334 MG/5ML PO SOLN: 30.0000 mL | ORAL | Status: DC | PRN

## 2023-02-25 MED ORDER — ONDANSETRON HCL 4 MG/2ML IJ SOLN
4.0000 mg | Freq: Four times a day (QID) | INTRAMUSCULAR | Status: DC | PRN
  Administered 2023-02-25: 4 mg via INTRAVENOUS
  Filled 2023-02-25: qty 2

## 2023-02-25 MED ORDER — BETAMETHASONE SOD PHOS & ACET 6 (3-3) MG/ML IJ SUSP
12.0000 mg | Freq: Once | INTRAMUSCULAR | Status: AC
  Administered 2023-02-25: 12 mg via INTRAMUSCULAR
  Filled 2023-02-25: qty 5

## 2023-02-25 MED ORDER — ACETAMINOPHEN 325 MG PO TABS: 650.0000 mg | ORAL_TABLET | ORAL | Status: DC | PRN

## 2023-02-25 MED ORDER — LACTATED RINGERS IV BOLUS
1000.0000 mL | Freq: Once | INTRAVENOUS | Status: AC
  Administered 2023-02-25: 1000 mL via INTRAVENOUS

## 2023-02-25 MED ORDER — LIDOCAINE HCL (PF) 1 % IJ SOLN: 30.0000 mL | INTRAMUSCULAR | Status: DC | PRN

## 2023-02-25 NOTE — MAU Note (Signed)
Teresa Garrett is a 27 y.o. at [redacted]w[redacted]d here in MAU reporting: RN pulled pt back due to call from registration for pt bleeding. CNM called to bedside when pt entered room. Pt states she had ctx all day and rates them 9/10. Pt states she felt like her water broke around 2045 but it was all blood. Pt reports DFM since that time. Pt reports only having time to put one pad on but the pad is saturated.  Dr. Debroah Loop notified via Vocera at 2133 Dr. Debroah Loop at bedside at 2135  Onset of complaint: 2045  Pain score: 9/10 Vitals:   02/25/23 2138 02/25/23 2145  BP: 127/74   Pulse: 87   SpO2: 100% 100%     FHT:149 Lab orders placed from triage:  Crist Fat, all admission labs.

## 2023-02-25 NOTE — MAU Provider Note (Signed)
Chief Complaint:  No chief complaint on file.   Event Date/Time   First Provider Initiated Contact with Patient 02/25/23 2124     HPI: Teresa Garrett is a 27 y.o. G3P1011 at 72w4dwho presents to maternity admissions reporting onset of contractions and vaginal bleeding earlier today which worsened an hour ago.. She reports good fetal movement, denies LOF, urinary symptoms, h/a, dizziness, n/v, diarrhea, constipation or fever/chills.   Gets prenatal care at [facility] in [place] and states has had no complications with this pregnancy except for some intermittent hypertension recently   Vaginal Bleeding The patient's primary symptoms include pelvic pain and vaginal bleeding. The patient's pertinent negatives include no genital odor. This is a new problem. The current episode started today. The problem occurs constantly. She is pregnant. Associated symptoms include abdominal pain. Pertinent negatives include no chills, fever or headaches. The vaginal discharge was bloody. The vaginal bleeding is heavier than menses. She has been passing clots. She has not been passing tissue. Nothing aggravates the symptoms. She has tried nothing for the symptoms.   RN Note: Teresa Garrett is a 27 y.o. at [redacted]w[redacted]d here in MAU reporting: RN pulled pt back due to call from registration for pt bleeding. CNM called to bedside when pt entered room. Pt states she had ctx all day and rates them 9/10. Pt states she felt like her water broke around 2045 but it was all blood. Pt reports DFM since that time. Pt reports only having time to put one pad on but the pad is saturated.  Dr. Debroah Loop notified via Vocera at 2133 Dr. 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)    Complication of anesthesia    Concussion    Family history of breast cancer    Headache     migraines-    PONV (postoperative nausea and vomiting)     Past obstetric history: OB History  Gravida Para Term Preterm AB Living  3 1 1   1 1   SAB IAB Ectopic Multiple Live Births  1       1    # Outcome Date GA Lbr Len/2nd Weight Sex Type Anes PTL Lv  3 Current           2 SAB           1 Term      Vag-Spont   LIV    Past Surgical History: Past Surgical History:  Procedure Laterality Date   FASCIOTOMY Right 07/03/2018   Procedure: ANTERIOR AND LATERAL COMPARTMENT RELEASE RIGHT LEG;  Surgeon: Nadara Mustard, MD;  Location: [facility] OR;  Service: Orthopedics;  Laterality: Right;   WISDOM TOOTH EXTRACTION     "gas"    Family History: Family History  Problem Relation Age of Onset   Breast cancer Other        dx 30s   Colon cancer Neg Hx    Esophageal cancer Neg Hx    Pancreatic cancer Neg Hx    Stomach cancer Neg Hx    Liver disease Neg Hx     Social History: Social History   Tobacco Use   Smoking status: Never   Smokeless tobacco: Never  Vaping Use   Vaping status: Never Used  Substance Use Topics   Alcohol use: No   Drug  use: No    Allergies:  Allergies  Allergen Reactions   Apple Juice Itching and Nausea Only    Meds:  Medications Prior to Admission  Medication Sig Dispense Refill Last Dose   ondansetron (ZOFRAN-ODT) 4 MG disintegrating tablet Take 1 tablet (4 mg total) by mouth every 6 (six) hours as needed for nausea. 20 tablet 0    Prenatal Vit-Fe Fumarate-FA (MULTIVITAMIN-PRENATAL) 27-0.8 MG TABS tablet Take 1 tablet by mouth daily at 12 noon.      promethazine (PHENERGAN) 25 MG tablet Take 1 tablet (25 mg total) by mouth every 6 (six) hours as needed for nausea or vomiting. 30 tablet 1    sertraline (ZOLOFT) 100 MG tablet Take 100 mg by mouth daily.      traZODone (DESYREL) 50 MG tablet Take by mouth.       I have reviewed patient's Past Medical Hx, Surgical Hx, Family Hx, Social Hx, medications and allergies.   ROS:  Review of Systems   Constitutional:  Negative for chills and fever.  Gastrointestinal:  Positive for abdominal pain.  Genitourinary:  Positive for pelvic pain and vaginal bleeding.  Neurological:  Negative for headaches.   Other systems negative  Physical Exam  No data found. Constitutional: Well-developed, well-nourished female in no acute distress.  Cardiovascular: normal rate and rhythm Respiratory: normal effort GI: Abd soft, non-tender, gravid appropriate for gestational age.   No rebound or guarding. MS: Extremities nontender, no edema, normal ROM Neurologic: Alert and oriented x 4.  GU: Neg CVAT.  PELVIC EXAM: Unable to visualize cervix due to large amount of clotted blood. Removed about clot then when digital exam done, about 5-630ml of watery blood came out.  Tested for ferning, none seen.   Dilation: 3.5 Effacement (%): 70 Station: -2 Presentation: Vertex Exam by:: Wynelle Bourgeois, CNM   FHT:  Baseline 145 , moderate variability, accelerations present, no decelerations Contractions: q 2 mins Irregular     Labs: Results for orders placed or performed during the hospital encounter of 02/25/23 (from the past 24 hour(s))  CBC     Status: Abnormal   Collection Time: 02/25/23  9:35 PM  Result Value Ref Range   WBC 14.2 (H) 4.0 - 10.5 K/uL   RBC 3.03 (L) 3.87 - 5.11 MIL/uL   Hemoglobin 8.5 (L) 12.0 - 15.0 g/dL   HCT 54.0 (L) 98.1 - 19.1 %   MCV 83.8 80.0 - 100.0 fL   MCH 28.1 26.0 - 34.0 pg   MCHC 33.5 30.0 - 36.0 g/dL   RDW 47.8 29.5 - 62.1 %   Platelets 183 150 - 400 K/uL   nRBC 0.0 0.0 - 0.2 %  Type and screen [facility]     Status: None   Collection Time: 02/25/23  9:35 PM  Result Value Ref Range   ABO/RH(D) O POS    Antibody Screen NEG    Sample Expiration      02/28/2023,2359 Performed at [facility], [address] [place], [place] [address]   Crist Fat Test     Status: None   Collection Time: 02/25/23  9:53 PM  Result Value Ref Range   POCT  Fern Test Negative = intact amniotic membranes   Group B strep by PCR     Status: None   Collection Time: 02/25/23 10:10 PM   Specimen: Vaginal/Rectal; Genital  Result Value Ref Range   Group B strep by PCR NEGATIVE NEGATIVE     Imaging:  Pt informed that the ultrasound is considered a limited OB ultrasound and is not intended to be a complete ultrasound exam.  Patient also informed that the ultrasound is not being completed with the intent of assessing for fetal or placental anomalies or any pelvic abnormalities.  Explained that the purpose of today's ultrasound is to assess for presentation.  Patient acknowledges the purpose of the exam and the limitations of the study.   Vertex per bedside US . Large pockets of amniotic fluid visualized  Formal US done at bedside Large amount of blood seen floating in amniotic fluid No obvious abruption at site of placenta AFI 41%ile  MAU Course/MDM: I have reviewed the triage vital signs and the nursing notes.   Pertinent labs & imaging results that were available during my care of the patient were reviewed by me and considered in my medical decision making (see chart for details).      I have reviewed her medical records including past results, notes and treatments.   I have ordered labs and reviewed results.  NST reviewed Consult Dr Debroah Loop who came to bedside with presentation, exam findings and test results.     Assessment: Single IUP at [redacted]w[redacted]d Vaginal bleeding, probable abruption Preterm Labor  Plan: Admit to Labor and Delivery Routine orders 2 IVs started MD to follow  Wynelle Bourgeois CNM, MSN Certified Nurse-Midwife 02/25/2023 9:24 PM

## 2023-02-25 NOTE — Anesthesia Preprocedure Evaluation (Addendum)
Anesthesia Evaluation  Patient identified by MRN, date of birth, ID band Patient awake    Reviewed: Allergy & Precautions, Patient's Chart, lab work & pertinent test results  History of Anesthesia Complications (+) PONV and history of anesthetic complications  Airway Mallampati: II  TM Distance: >3 FB Neck ROM: Full    Dental no notable dental hx.    Pulmonary asthma    Pulmonary exam normal        Cardiovascular hypertension, Normal cardiovascular exam     Neuro/Psych  Headaches  Anxiety Depression       GI/Hepatic negative GI ROS, Neg liver ROS,,,  Endo/Other  negative endocrine ROS    Renal/GU negative Renal ROS     Musculoskeletal negative musculoskeletal ROS (+)    Abdominal   Peds  Hematology  (+) Blood dyscrasia (Hgb 8.5), anemia   Anesthesia Other Findings Day of surgery medications reviewed with patient.  Reproductive/Obstetrics (+) Pregnancy                              Anesthesia Physical Anesthesia Plan  ASA: 3  Anesthesia Plan: Epidural   Post-op Pain Management:    Induction:   PONV Risk Score and Plan: Treatment may vary due to age or medical condition  Airway Management Planned: Natural Airway  Additional Equipment: Fetal Monitoring  Intra-op Plan:   Post-operative Plan:   Informed Consent: I have reviewed the patients History and Physical, chart, labs and discussed the procedure including the risks, benefits and alternatives for the proposed anesthesia with the patient or authorized representative who has indicated his/her understanding and acceptance.       Plan Discussed with:   Anesthesia Plan Comments:         Anesthesia Quick Evaluation

## 2023-02-25 NOTE — Consult Note (Signed)
[name]  Prenatal Consult       02/25/2023  10:24 PM   I was asked by Dr. Debroah Loop to consult on this patient for possible/anticipated 34 week preterm delivery. I had the pleasure of meeting with Teresa Garrett today. She is a 27 yo at [redacted]w[redacted]d. Pregnancy complicated by vaginal bleeding. She and her partner are expecting a baby girl, to be named "Teddy".   I explained that the neonatal intensive care team would be present for the delivery and outlined the likely delivery room course for this baby including routine resuscitation and NRP-guided approaches to the treatment of respiratory distress. We discussed other common problems associated with prematurity including respiratory distress syndrome/CLD, apnea, feeding issues, temperature regulation, and infection risk. We briefly discussed IVH/PVL, ROP, and NEC and that these are complications associated with prematurity, but that by 32 weeks are uncommon.   We discussed the average length of stay but I noted that the actual LOS would depend on the severity of problems encountered and response to treatments. We discussed visitation policies and the resources available while her child is in the hospital.  We discussed the importance of good nutrition and various methods of providing nutrition (parenteral hyperalimentation, gavage feedings and/or oral feeding). We discussed the benefits of human milk. I encouraged breast feeding and pumping soon after birth and outlined resources that are available to support breast feeding. We discussed the possibility of using donor breast milk as a bridge.  Thank you for involving [place] in the care of this patient. A member of our team will be available should the family have additional questions.  Time for consultation: approximately 20 minutes of face-to-face time in discussion of the risks and medical care associated with preterm delivery.   Servando Salina, MD Attending Neonatologist

## 2023-02-25 NOTE — MAU Note (Signed)
US at bedside

## 2023-02-26 DIAGNOSIS — O4593 Premature separation of placenta, unspecified, third trimester: Secondary | ICD-10-CM

## 2023-02-26 DIAGNOSIS — Z3A34 34 weeks gestation of pregnancy: Secondary | ICD-10-CM | POA: Diagnosis not present

## 2023-02-26 LAB — COMPREHENSIVE METABOLIC PANEL
ALT: 11 U/L (ref 0–44)
AST: 20 U/L (ref 15–41)
Albumin: 2.6 g/dL — ABNORMAL LOW (ref 3.5–5.0)
Alkaline Phosphatase: 123 U/L (ref 38–126)
Anion gap: 11 (ref 5–15)
BUN: 7 mg/dL (ref 6–20)
CO2: 22 mmol/L (ref 22–32)
Calcium: 8.9 mg/dL (ref 8.9–10.3)
Chloride: 103 mmol/L (ref 98–111)
Creatinine, Ser: 0.69 mg/dL (ref 0.44–1.00)
GFR, Estimated: >60 mL/min (ref >60)
Glucose, Bld: 117 mg/dL — ABNORMAL HIGH (ref 70–99)
Potassium: 3.8 mmol/L (ref 3.5–5.1)
Sodium: 136 mmol/L (ref 135–145)
Total Bilirubin: 0.4 mg/dL (ref 0.3–1.2)
Total Protein: 5.5 g/dL — ABNORMAL LOW (ref 6.5–8.1)

## 2023-02-26 LAB — CBC
HCT: 23.3 % — ABNORMAL LOW (ref 36.0–46.0)
Hemoglobin: 7.8 g/dL — ABNORMAL LOW (ref 12.0–15.0)
MCH: 27.6 pg (ref 26.0–34.0)
MCHC: 33.5 g/dL (ref 30.0–36.0)
MCV: 82.3 fL (ref 80.0–100.0)
Platelets: 163 10*3/uL (ref 150–400)
RBC: 2.83 MIL/uL — ABNORMAL LOW (ref 3.87–5.11)
RDW: 11.7 % (ref 11.5–15.5)
WBC: 19.7 10*3/uL — ABNORMAL HIGH (ref 4.0–10.5)
nRBC: 0 % (ref 0.0–0.2)

## 2023-02-26 LAB — RPR: RPR Ser Ql: NONREACTIVE

## 2023-02-26 LAB — OB RESULTS CONSOLE ABO/RH: RH Type: POSITIVE

## 2023-02-26 LAB — HIV ANTIBODY (ROUTINE TESTING W REFLEX): HIV Screen 4th Generation wRfx: NONREACTIVE

## 2023-02-26 MED ORDER — TRAZODONE HCL 50 MG PO TABS
50.0000 mg | ORAL_TABLET | Freq: Every day | ORAL | Status: DC
  Filled 2023-02-26 (×2): qty 1

## 2023-02-26 MED ORDER — SIMETHICONE 80 MG PO CHEW: 80.0000 mg | CHEWABLE_TABLET | ORAL | Status: DC | PRN

## 2023-02-26 MED ORDER — BENZOCAINE-MENTHOL 20-0.5 % EX AERO: 1.0000 | INHALATION_SPRAY | CUTANEOUS | Status: DC | PRN

## 2023-02-26 MED ORDER — ZOLPIDEM TARTRATE 5 MG PO TABS: 5.0000 mg | ORAL_TABLET | Freq: Every evening | ORAL | Status: DC | PRN

## 2023-02-26 MED ORDER — TRANEXAMIC ACID-NACL 1000-0.7 MG/100ML-% IV SOLN
INTRAVENOUS | Status: AC
  Administered 2023-02-26: 1000 mg
  Filled 2023-02-26: qty 100

## 2023-02-26 MED ORDER — PRENATAL MULTIVITAMIN CH
1.0000 | ORAL_TABLET | Freq: Every day | ORAL | Status: DC
  Administered 2023-02-26 – 2023-02-27 (×2): 1 via ORAL
  Filled 2023-02-26 (×2): qty 1

## 2023-02-26 MED ORDER — ONDANSETRON HCL 4 MG/2ML IJ SOLN: 4.0000 mg | INTRAMUSCULAR | Status: DC | PRN

## 2023-02-26 MED ORDER — DIBUCAINE (PERIANAL) 1 % EX OINT: 1.0000 | TOPICAL_OINTMENT | CUTANEOUS | Status: DC | PRN

## 2023-02-26 MED ORDER — DIPHENHYDRAMINE HCL 25 MG PO CAPS: 25.0000 mg | ORAL_CAPSULE | Freq: Four times a day (QID) | ORAL | Status: DC | PRN

## 2023-02-26 MED ORDER — WITCH HAZEL-GLYCERIN EX PADS: 1.0000 | MEDICATED_PAD | CUTANEOUS | Status: DC | PRN

## 2023-02-26 MED ORDER — IBUPROFEN 600 MG PO TABS
600.0000 mg | ORAL_TABLET | Freq: Four times a day (QID) | ORAL | Status: DC
  Administered 2023-02-26 – 2023-02-28 (×8): 600 mg via ORAL
  Filled 2023-02-26 (×8): qty 1

## 2023-02-26 MED ORDER — SERTRALINE HCL 50 MG PO TABS
100.0000 mg | ORAL_TABLET | Freq: Every day | ORAL | Status: DC
  Administered 2023-02-26 – 2023-02-27 (×2): 100 mg via ORAL
  Filled 2023-02-26 (×2): qty 2

## 2023-02-26 MED ORDER — SENNOSIDES-DOCUSATE SODIUM 8.6-50 MG PO TABS
2.0000 | ORAL_TABLET | Freq: Every day | ORAL | Status: DC
  Administered 2023-02-27: 2 via ORAL
  Filled 2023-02-26: qty 2

## 2023-02-26 MED ORDER — COCONUT OIL OIL
1.0000 | TOPICAL_OIL | Status: DC | PRN
  Administered 2023-02-27: 1 via TOPICAL

## 2023-02-26 MED ORDER — LIDOCAINE HCL (PF) 1 % IJ SOLN
INTRAMUSCULAR | Status: DC | PRN
  Administered 2023-02-25 (×2): 4 mL via EPIDURAL

## 2023-02-26 MED ORDER — TETANUS-DIPHTH-ACELL PERTUSSIS 5-2.5-18.5 LF-MCG/0.5 IM SUSY: 0.5000 mL | PREFILLED_SYRINGE | Freq: Once | INTRAMUSCULAR | Status: DC

## 2023-02-26 MED ORDER — ACETAMINOPHEN 325 MG PO TABS: 650.0000 mg | ORAL_TABLET | ORAL | Status: DC | PRN

## 2023-02-26 MED ORDER — ONDANSETRON HCL 4 MG PO TABS: 4.0000 mg | ORAL_TABLET | ORAL | Status: DC | PRN

## 2023-02-26 NOTE — Social Work (Signed)
Patient screened out for psychosocial assessment since none of the following apply: Psychosocial stressors documented in mother or baby's chart Gestation less than 32 weeks Code at delivery  Infant with anomalies Please contact the Clinical Social Worker if specific needs arise, by MOB's request, or if MOB scores greater than 9/yes to question 10 on Edinburgh Postpartum Depression Screen.  Vivi Barrack, MSW, LCSW [facility]  Clinical Social Worker  Mar 24, 2023  9:51 AM

## 2023-02-26 NOTE — Anesthesia Procedure Notes (Signed)
Epidural Patient location during procedure: OB Start time: 02/25/2023 11:38 PM End time: 02/25/2023 11:41 PM  Staffing Anesthesiologist: Kaylyn Layer, MD Performed: anesthesiologist   Preanesthetic Checklist Completed: patient identified, IV checked, risks and benefits discussed, monitors and equipment checked, pre-op evaluation and timeout performed  Epidural Patient position: sitting Prep: DuraPrep and site prepped and draped Patient monitoring: continuous pulse ox, blood pressure and heart rate Approach: midline Location: L3-L4 Injection technique: LOR air  Needle:  Needle type: Tuohy  Needle gauge: 17 G Needle length: 9 cm Needle insertion depth: 5 cm Catheter type: closed end flexible Catheter size: 19 Gauge Catheter at skin depth: 10 cm Test dose: negative and Other (1% lidocaine)  Assessment Events: blood not aspirated, no cerebrospinal fluid, injection not painful, no injection resistance, no paresthesia and negative IV test  Additional Notes Patient identified. Risks, benefits, and alternatives discussed with patient including but not limited to bleeding, infection, nerve damage, paralysis, failed block, incomplete pain control, headache, blood pressure changes, nausea, vomiting, reactions to medication, itching, and postpartum back pain. Confirmed with bedside nurse the patient's most recent platelet count. Confirmed with patient that they are not currently taking any anticoagulation, have any bleeding history, or any family history of bleeding disorders. Patient expressed understanding and wished to proceed. All questions were answered. Sterile technique was used throughout the entire procedure. Please see nursing notes for vital signs.   Crisp LOR on first pass. Test dose was given through epidural catheter and negative prior to continuing to dose epidural or start infusion. Warning signs of high block given to the patient including shortness of breath,  tingling/numbness in hands, complete motor block, or any concerning symptoms with instructions to call for help. Patient was given instructions on fall risk and not to get out of bed. All questions and concerns addressed with instructions to call with any issues or inadequate analgesia.  Reason for block:procedure for pain

## 2023-02-26 NOTE — Anesthesia Postprocedure Evaluation (Signed)
Anesthesia Post Note  Patient: Teresa Garrett  Procedure(s) Performed: AN AD HOC LABOR EPIDURAL     Patient location during evaluation: OB High Risk Anesthesia Type: Epidural Level of consciousness: awake, oriented and awake and alert Pain management: pain level controlled Vital Signs Assessment: post-procedure vital signs reviewed and stable Respiratory status: spontaneous breathing, respiratory function stable and nonlabored ventilation Cardiovascular status: stable Postop Assessment: no headache, adequate PO intake, able to ambulate, patient able to bend at knees and no apparent nausea or vomiting Anesthetic complications: no   No notable events documented.  Last Vitals:  Vitals:   02/26/23 0607 02/26/23 0751  BP: 120/64 115/64  Pulse: (!) 50 64  Resp:  18  Temp:  36.7 C  SpO2:  99%    Last Pain:  Vitals:   02/26/23 0810  TempSrc:   PainSc: 0-No pain   Pain Goal:                Epidural/Spinal Function Cutaneous sensation: Normal sensation (02/26/23 0810), Patient able to flex knees: Yes (02/26/23 0810), Patient able to lift hips off bed: Yes (02/26/23 0810), Back pain beyond tenderness at insertion site: No (02/26/23 0810), Progressively worsening motor and/or sensory loss: No (02/26/23 0810), Bowel and/or bladder incontinence post epidural: No (02/26/23 0810)  Rhealyn Cullen

## 2023-02-26 NOTE — Lactation Note (Signed)
This note was copied from a baby's chart.  NICU Lactation Consultation Note  Patient Name: Teresa Garrett [id number]'Q Date: 02/26/2023 Age:25 hours  Reason for consult: Initial assessment; NICU baby; Late-preterm 34-36.6wks; Infant < 6lbs  SUBJECTIVE  LC in to visit with P2 Mom and FOB of LPT "Remigio Eisenmenger" in the NICU.  Mom breast fed her first baby for 9 months, struggled a bit with pumping once she returned to work.  Mom resting in bed currently.  LC offered to assist with first pumping, providing a pumping top.  Reviewed settings on pump until collecting > 20 ml per session.  Mom desires to breast feed along with some bottle feeding of breast milk.  Briefly reviewed IDF and importance of STS being the first step to baby latching to the breast.  Mom encouraged to ask for lactation prn.  OBJECTIVE Infant data: No data recorded O2 Device: CPAP FiO2 (%): 21 %  Infant feeding assessment Scale for Readiness: 2   Maternal data: G3P1011 Vaginal, Spontaneous Has patient been taught Hand Expression?: Yes Hand Expression Comments: Reviewed this with Mom Significant Breast History:: ++ breast changes Current breast feeding challenges:: Infant separation Does the patient have breastfeeding experience prior to this delivery?: Yes How long did the patient breastfeed?: 9 months Pumping frequency: Initiaited double pumping at 9 hrs post delivery, encouraged pumping every 3 hrs/day and 4 hrs/night Flange Size: 21 Risk factor for low/delayed milk supply:: LPTI in NICU/infant separation  Pump: Hands Free, DEBP, Personal (Medela DEBP, unsure of model type)  Feeding Status: NPO  Maternal: No data recorded INTERVENTIONS/PLAN Interventions: Interventions: Breast feeding basics reviewed; Skin to skin; Breast massage; Hand express; DEBP; Education; [employer] brochure; CDC Guidelines for Breast Pump Cleaning Tools: Pump; Flanges; Hands-free pumping top Pump Education: Setup, frequency, and  cleaning; Milk Storage  Plan: 1- STS with "Teddy" in the NICU as much as possible 2- Massage breasts and hand express extra drops of colostrum  3-Every 3 hrs pump both breast on initiation setting, goal of 8 pumpings in 24 hrs.  Consult Status: NICU follow-up NICU Follow-up type: New admission follow up   Judee Clara 02/26/2023, 9:24 AM

## 2023-02-26 NOTE — Discharge Summary (Signed)
Postpartum Discharge Summary  Date of Service updated: 02/28/23     Patient Name: Teresa Garrett DOB: Sep 18, 1995 MRN: [id number]  Date of admission: 02/25/2023 Delivery date:02/26/2023 Delivering provider: Wyn Forster Date of discharge: 02/28/2023  Admitting diagnosis: Vaginal bleeding in pregnancy, third trimester [O46.93] Intrauterine pregnancy: [redacted]w[redacted]d     Secondary diagnosis:  Principal Problem:   Vaginal bleeding in pregnancy, third trimester  Additional problems: Placental abruption    Discharge diagnosis: Preterm Pregnancy Delivered                                              Post partum procedures: Iron infusion Augmentation: N/A Complications: Placental Abruption  Hospital course: Onset of Labor With Vaginal Delivery      27 y.o. yo G3P1011 at [redacted]w[redacted]d was admitted in Active Labor on 02/25/2023. Labor course was complicated by PTL secondary to placental abruption and blood loss anemia  Membrane Rupture Time/Date:  ,   Delivery Method:Vaginal, Spontaneous Operative Delivery:N/A Episiotomy: None Lacerations:  None Patient had an unremarkable postpartum course..  She is ambulating, tolerating a regular diet, passing flatus, and urinating well. Patient is discharged home in stable condition on 02/28/23.  Newborn Data: Birth date:02/26/2023 Birth time:12:07 AM Gender:Female Living status:Living Apgars:6 ,9  Weight:2330 g  Magnesium Sulfate received: No BMZ received: Yes, single dose <24 hour prior to delivery Rhophylac:N/A MMR:Yes T-DaP: NA Flu: N/A Transfusion:No  Physical exam  Vitals:   02/27/23 1336 02/27/23 1406 02/27/23 1421 02/27/23 2341  BP: 110/68 109/70 111/71 116/61  Pulse: 81 84 77 (!) 58  Resp: 18 17  18   Temp: 98.8 F (37.1 C) 98 F (36.7 C)  98.6 F (37 C)  TempSrc: Oral Oral  Oral  SpO2: 100% 100%  100%  Weight:      Height:       General: alert Lochia: appropriate Uterine Fundus: firm Incision: N/A DVT Evaluation: No evidence of DVT  seen on physical exam. Labs: Lab Results  Component Value Date   WBC 19.7 (H) 02/26/2023   HGB 7.8 (L) 02/26/2023   HCT 23.3 (L) 02/26/2023   MCV 82.3 02/26/2023   PLT 163 02/26/2023      Latest Ref Rng & Units 02/25/2023    9:35 PM  CMP  Glucose 70 - 99 mg/dL 409   BUN 6 - 20 mg/dL 7   Creatinine 8.11 - 9.14 mg/dL 7.82   Sodium 956 - 213 mmol/L 136   Potassium 3.5 - 5.1 mmol/L 3.8   Chloride 98 - 111 mmol/L 103   CO2 22 - 32 mmol/L 22   Calcium 8.9 - 10.3 mg/dL 8.9   Total Protein 6.5 - 8.1 g/dL 5.5   Total Bilirubin 0.3 - 1.2 mg/dL 0.4   Alkaline Phos 38 - 126 U/L 123   AST 15 - 41 U/L 20   ALT 0 - 44 U/L 11    Edinburgh Score:    02/27/2023    9:00 AM  Edinburgh Postnatal Depression Scale Screening Tool  I have been able to laugh and see the funny side of things. 0  I have looked forward with enjoyment to things. 0  I have blamed myself unnecessarily when things went wrong. 1  I have been anxious or worried for no good reason. 1  I have felt scared or panicky for no good reason. 0  Things have been getting  on top of me. 0  I have been so unhappy that I have had difficulty sleeping. 0  I have felt sad or miserable. 0  I have been so unhappy that I have been crying. 0  The thought of harming myself has occurred to me. 0  Edinburgh Postnatal Depression Scale Total 2     After visit meds:  Allergies as of 02/28/2023       Reactions   Apple Juice Itching, Nausea Only        Medication List     STOP taking these medications    ondansetron 4 MG disintegrating tablet Commonly known as: ZOFRAN-ODT   promethazine 25 MG tablet Commonly known as: PHENERGAN       TAKE these medications    ibuprofen 600 MG tablet Commonly known as: ADVIL Take 1 tablet (600 mg total) by mouth every 6 (six) hours.   multivitamin-prenatal 27-0.8 MG Tabs tablet Take 1 tablet by mouth daily at 12 noon.   sertraline 100 MG tablet Commonly known as: ZOLOFT Take 100 mg by mouth  daily.   traZODone 50 MG tablet Commonly known as: DESYREL Take by mouth.         Discharge home in stable condition Infant Feeding: Bottle Infant Disposition:NICU Discharge instruction: per After Visit Summary and Postpartum booklet. Activity: Advance as tolerated. Pelvic rest for 6 weeks.  Diet: routine diet Future Appointments:No future appointments. Follow up Visit:  Follow-up Information     [facility] Follow up.   Contact information: [facility] [place] [address] [phone]               Please schedule postpartum visit with [facility]  Please schedule this patient for a In person postpartum visit in 6 weeks with the following provider: Any provider. Additional Postpartum F/U:Postpartum Depression checkup and anemia   High risk pregnancy complicated by:  placental abruption and PTL Delivery mode:  Vaginal, Spontaneous Anticipated Birth Control:  Unsure   02/28/2023 Hermina Staggers, MD

## 2023-02-27 LAB — SURGICAL PATHOLOGY

## 2023-02-27 MED ORDER — IRON SUCROSE 500 MG IVPB - SIMPLE MED
500.0000 mg | Freq: Once | INTRAVENOUS | Status: AC
  Administered 2023-02-27: 500 mg via INTRAVENOUS
  Filled 2023-02-27: qty 275

## 2023-02-27 NOTE — H&P (Signed)
Chief Complaint:  No chief complaint on file.    Event Date/Time   First Provider Initiated Contact with Patient 02/25/23 2124     HPI: Teresa Garrett is a 27 y.o. G3P1011 at 20w4dwho presents to maternity admissions reporting onset of contractions and vaginal bleeding earlier today which worsened an hour ago.. She reports good fetal movement, denies LOF, urinary symptoms, h/a, dizziness, n/v, diarrhea, constipation or fever/chills.   Gets [facility] care at [facility] [place] and states has had no complications with this pregnancy except for some intermittent hypertension recently    Vaginal Bleeding The patient's primary symptoms include pelvic pain and vaginal bleeding. The patient's pertinent negatives include no genital odor. This is a new problem. The current episode started today. The problem occurs constantly. She is pregnant. Associated symptoms include abdominal pain. Pertinent negatives include no chills, fever or headaches. The vaginal discharge was bloody. The vaginal bleeding is heavier than menses. She has been passing clots. She has not been passing tissue. Nothing aggravates the symptoms. She has tried nothing for the symptoms.    RN Note: Teresa Garrett is a 27 y.o. at [redacted]w[redacted]d here in MAU reporting: RN pulled pt back due to call from registration for pt bleeding. CNM called to bedside when pt entered room. Pt states she had ctx all day and rates them 9/10. Pt states she felt like her water broke around 2045 but it was all blood. Pt reports DFM since that time. Pt reports only having time to put one pad on but the pad is saturated.  Dr. Debroah Loop notified via Vocera at 2133 Dr. 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)     Complication of anesthesia     Concussion     Family history of breast cancer      Headache      migraines-    PONV (postoperative nausea and vomiting)            Past obstetric history:                 OB History  Gravida Para Term Preterm AB Living  3 1 1   1 1   SAB IAB Ectopic Multiple Live Births     1       1        # Outcome Date GA Lbr Len/2nd Weight Sex Type Anes PTL Lv  3 Current                    2 SAB                    1 Term           Vag-Spont     LIV      Past Surgical History:      Past Surgical History:  Procedure Laterality Date   FASCIOTOMY Right 07/03/2018    Procedure: ANTERIOR AND LATERAL COMPARTMENT RELEASE RIGHT LEG;  Surgeon: Nadara Mustard, MD;  Location: [facility] OR;  Service: Orthopedics;  Laterality: Right;   WISDOM TOOTH EXTRACTION        "gas"          Family History:      Family History  Problem Relation Age  of Onset   Breast cancer Other          dx 30s   Colon cancer Neg Hx     Esophageal cancer Neg Hx     Pancreatic cancer Neg Hx     Stomach cancer Neg Hx     Liver disease Neg Hx            Social History: Social History  Social History        Tobacco Use   Smoking status: Never   Smokeless tobacco: Never  Vaping Use   Vaping status: Never Used  Substance Use Topics   Alcohol use: No   Drug use: No        Allergies:  Allergies      Allergies  Allergen Reactions   Apple Juice Itching and Nausea Only        Meds:         Medications Prior to Admission  Medication Sig Dispense Refill Last Dose   ondansetron (ZOFRAN-ODT) 4 MG disintegrating tablet Take 1 tablet (4 mg total) by mouth every 6 (six) hours as needed for nausea. 20 tablet 0     Prenatal Vit-Fe Fumarate-FA (MULTIVITAMIN-PRENATAL) 27-0.8 MG TABS tablet Take 1 tablet by mouth daily at 12 noon.         promethazine (PHENERGAN) 25 MG tablet Take 1 tablet (25 mg total) by mouth every 6 (six) hours as needed for nausea or vomiting. 30 tablet 1     sertraline (ZOLOFT) 100 MG tablet Take 100 mg by mouth daily.         traZODone (DESYREL) 50  MG tablet Take by mouth.                I have reviewed patient's Past Medical Hx, Surgical Hx, Family Hx, Social Hx, medications and allergies.    ROS:  Review of Systems  Constitutional:  Negative for chills and fever.  Gastrointestinal:  Positive for abdominal pain.  Genitourinary:  Positive for pelvic pain and vaginal bleeding.  Neurological:  Negative for headaches.    Other systems negative   Physical Exam  No data found. Constitutional: Well-developed, well-nourished female in no acute distress.  Cardiovascular: normal rate and rhythm Respiratory: normal effort GI: Abd soft, non-tender, gravid appropriate for gestational age.   No rebound or guarding. MS: Extremities nontender, no edema, normal ROM Neurologic: Alert and oriented x 4.  GU: Neg CVAT.   PELVIC EXAM: Unable to visualize cervix due to large amount of clotted blood. Removed about clot then when digital exam done, about 5-656ml of watery blood came out.  Tested for ferning, none seen.   Dilation: 3.5 Effacement (%): 70 Station: -2 Presentation: Vertex Exam by:: Wynelle Bourgeois, CNM FHT:  Baseline 145 , moderate variability, accelerations present, no decelerations Contractions: q 2 mins Irregular     Labs: Lab Results Last 24 Hours        Results for orders placed or performed during the hospital encounter of 02/25/23 (from the past 24 hour(s))  CBC     Status: Abnormal    Collection Time: 02/25/23  9:35 PM  Result Value Ref Range    WBC 14.2 (H) 4.0 - 10.5 K/uL    RBC 3.03 (L) 3.87 - 5.11 MIL/uL    Hemoglobin 8.5 (L) 12.0 - 15.0 g/dL    HCT 01.6 (L) 01.0 - 46.0 %    MCV 83.8 80.0 - 100.0 fL    MCH 28.1 26.0 - 34.0 pg  MCHC 33.5 30.0 - 36.0 g/dL    RDW 40.9 81.1 - 91.4 %    Platelets 183 150 - 400 K/uL    nRBC 0.0 0.0 - 0.2 %  Type and screen [facility]     Status: None    Collection Time: 02/25/23  9:35 PM  Result Value Ref Range    ABO/RH(D) O POS      Antibody Screen  NEG      Sample Expiration          02/28/2023,2359 Performed at [facility], [address] [place], [place] [address]    Crist Fat Test     Status: None    Collection Time: 02/25/23  9:53 PM  Result Value Ref Range    POCT Fern Test Negative = intact amniotic membranes    Group B strep by PCR     Status: None    Collection Time: 02/25/23 10:10 PM    Specimen: Vaginal/Rectal; Genital  Result Value Ref Range    Group B strep by PCR NEGATIVE NEGATIVE        Imaging:  Pt informed that the ultrasound is considered a limited OB ultrasound and is not intended to be a complete ultrasound exam.  Patient also informed that the ultrasound is not being completed with the intent of assessing for fetal or placental anomalies or any pelvic abnormalities.  Explained that the purpose of today's ultrasound is to assess for presentation.  Patient acknowledges the purpose of the exam and the limitations of the study.   Vertex per bedside US . Large pockets of amniotic fluid visualized   Formal US done at bedside Large amount of blood seen floating in amniotic fluid No obvious abruption at site of placenta AFI 41%ile   MAU Course/MDM: I have reviewed the triage vital signs and the nursing notes.   Pertinent labs & imaging results that were available during my care of the patient were reviewed by me and considered in my medical decision making (see chart for details).      I have reviewed her medical records including past results, notes and treatments.    I have ordered labs and reviewed results.  NST reviewed Consult Dr Debroah Loop who came to bedside with presentation, exam findings and test results.      Assessment: Single IUP at [redacted]w[redacted]d Vaginal bleeding, probable abruption Preterm Labor   Plan: Admit to Labor and Delivery Routine orders 2 IVs started MD to follow   Wynelle Bourgeois CNM, MSN Certified Nurse-Midwife 02/25/2023 9:24 PM  Attestation of Attending Supervision of Advanced  Practitioner (CNM/NP/PA): Evaluation and management procedures were performed by the Advanced Practitioner under my supervision and collaboration.  I have reviewed the Advanced Practitioner's note and chart, and I agree with the management and plan.  Scheryl Darter MD

## 2023-02-27 NOTE — Lactation Note (Signed)
This note was copied from a baby's chart.  NICU Lactation Consultation Note  Patient Name: Teresa Garrett [id number]'Q Date: 02/27/2023 Age:25 hours  Reason for consult: Follow-up assessment; NICU baby; Late-preterm 34-36.6wks; Infant < 6lbs  SUBJECTIVE Visited with family of 62 hours old LPI NICU female; Teresa Garrett is a P2 and experienced breastfeeding. She reported she's been pumping consistently and getting enough colostrum to collect, praised her for her efforts. Noticed she didn't pump last night, explained the importance of consistent pumping at least once in the middle of the night. Reviewed lactogenesis II, pumping schedule, benefits of STS care and anticipatory guidelines.   OBJECTIVE Infant data: Mother's Current Feeding Choice: Breast Milk and Donor Milk  O2 Device: (S) Room Air FiO2 (%): 21 %  Infant feeding assessment Scale for Readiness: 2   Maternal data: G3P1011 Vaginal, Spontaneous Has patient been taught Hand Expression?: Yes Hand Expression Comments: Reviewed this with Mom Significant Breast History:: ++ breast changes Current breast feeding challenges:: NICU admission Does the patient have breastfeeding experience prior to this delivery?: Yes How long did the patient breastfeed?: 9 months Pumping frequency: 6 times/24 hours Pumped volume: 3 mL Flange Size: 21 Risk factor for low/delayed milk supply:: LPI, infant separation  Pump: Hands Free, DEBP, Personal (Medela DEBP, unsure of model type)  ASSESSMENT Infant: Feeding Status: Scheduled 8-11-2-5 Feeding method: Tube/Gavage (Bolus)  Maternal: Milk volume: Normal  INTERVENTIONS/PLAN Interventions: Interventions: Breast feeding basics reviewed; Coconut oil; DEBP; Education Tools: Flanges; Pump; Coconut oil; Hands-free pumping top Pump Education: Setup, frequency, and cleaning; Milk Storage  Plan: Encouraged pumping every 3 hours, ideally 8 pumping sessions/24 hours Breast massage, hand expression  and coconut oil were also encouraged prior pumping She'll call for assistance when baby is ready to go to breast, if she doesn't latch, she'll continue engaging in STS care  FOB present and supportive. All questions and concerns answered, family to contact [facility] services PRN.  Consult Status: NICU follow-up NICU Follow-up type: Maternal D/C visit   Teresa Garrett Teresa Garrett 02/27/2023, 2:25 PM

## 2023-02-27 NOTE — Progress Notes (Signed)
Post Partum Day 1 Subjective: no complaints and up ad lib Tolerating po Objective: Blood pressure 111/71, pulse 77, temperature 98 F (36.7 C), temperature source Oral, resp. rate 17, height 5\' 7"  (1.702 m), weight 72.6 kg, last menstrual period 06/28/2022, SpO2 100%, unknown if currently breastfeeding.  Physical Exam:  General: alert, cooperative, and no distress Lochia: appropriate Uterine Fundus: firm Incision:  DVT Evaluation: No evidence of DVT seen on physical exam.  Recent Labs    02/25/23 2135 02/26/23 0606  HGB 8.5* 7.8*  HCT 25.4* 23.3*    Assessment/Plan: Plan for discharge tomorrow and Breastfeeding Receiving venofer   LOS: 2 days   Lazaro Arms, MD 02/27/2023, 3:28 PM

## 2023-02-28 ENCOUNTER — Ambulatory Visit (HOSPITAL_COMMUNITY): Payer: Self-pay

## 2023-02-28 MED ORDER — IBUPROFEN 600 MG PO TABS: 600.0000 mg | ORAL_TABLET | Freq: Four times a day (QID) | ORAL | 0 refills | Status: AC

## 2023-02-28 NOTE — Lactation Note (Signed)
This note was copied from a baby's chart.  NICU Lactation Consultation Note  Patient Name: Teresa Garrett Date: 02/28/2023 Age:25 hours  Reason for consult: Follow-up assessment; NICU baby; Late-preterm 34-36.6wks; Maternal discharge; Infant < 6lbs  SUBJECTIVE Visited with family of 58 0/7 weeks AGA NICU female; [name] is a P2 and reports she's been pumping consistently, her supply continues to slowly increase, praised her for her efforts. She got discharged today, but forgot to bring the tubing from the pump in her room. Provided the missing pieces; she now has everything she needs to pump whenever she comes to the hospital to visit baby. Reviewed discharge education, pumping schedule, pump settings and the importance of consistent pumping for the onset of lactogenesis II and the prevention of engorgement.   OBJECTIVE Infant data: Mother's Current Feeding Choice: Breast Milk and Donor Milk  O2 Device: Room Air FiO2 (%): 21 %  Infant feeding assessment Scale for Readiness: 2   Maternal data: G3P1011 Vaginal, Spontaneous Current breast feeding challenges:: NICU admission Pumping frequency: 6 times/24 hours Pumped volume: 20 mL Flange Size: 21 Risk factor for low/delayed milk supply:: LPI, infant separation  Pump: Hands Free, DEBP, Personal (Medela DEBP, unsure of model type)  ASSESSMENT Infant: Feeding Status: Scheduled 8-11-2-5 Feeding method: Tube/Gavage (Bolus)  Maternal: Milk volume: Normal  INTERVENTIONS/PLAN Interventions: Interventions: Breast feeding basics reviewed; Coconut oil; DEBP; Education Discharge Education: Engorgement and breast care Tools: Pump; Flanges; Coconut oil Pump Education: Setup, frequency, and cleaning; Milk Storage  Plan: Encouraged to continue pumping every 3 hours, ideally 8 pumping sessions/24 hours She'll change her pump settings from initiate to maintain mode once she gets 20 ml of EBM combined in three consecutive  pumping sessions She'll call for assistance when baby is ready to go to breast, if she doesn't latch, she'll continue engaging in STS care   FOB and Ms. Fash brother present and supportive. All questions and concerns answered, family to contact [facility] services PRN.  Consult Status: NICU follow-up NICU Follow-up type: Verify absence of engorgement; Verify onset of copious milk   Daryel Kenneth S Gabryella Murfin 02/28/2023, 2:47 PM

## 2023-02-28 NOTE — Plan of Care (Signed)
  Problem: Education: Goal: Knowledge of Childbirth will improve Outcome: Adequate for Discharge Goal: Ability to make informed decisions regarding treatment and plan of care will improve Outcome: Adequate for Discharge Goal: Ability to state and carry out methods to decrease the pain will improve Outcome: Adequate for Discharge Goal: Individualized Educational Video(s) Outcome: Adequate for Discharge   Problem: Coping: Goal: Ability to verbalize concerns and feelings about labor and delivery will improve Outcome: Adequate for Discharge   Problem: Life Cycle: Goal: Ability to make normal progression through stages of labor will improve Outcome: Adequate for Discharge Goal: Ability to effectively push during vaginal delivery will improve Outcome: Adequate for Discharge   Problem: Role Relationship: Goal: Will demonstrate positive interactions with the child Outcome: Adequate for Discharge   Problem: Safety: Goal: Risk of complications during labor and delivery will decrease Outcome: Adequate for Discharge   Problem: Pain Management: Goal: Relief or control of pain from uterine contractions will improve Outcome: Adequate for Discharge   Problem: Education: Goal: Knowledge of General Education information will improve Description: Including pain rating scale, medication(s)/side effects and non-pharmacologic comfort measures Outcome: Adequate for Discharge   Problem: Health Behavior/Discharge Planning: Goal: Ability to manage health-related needs will improve Outcome: Adequate for Discharge   Problem: Clinical Measurements: Goal: Ability to maintain clinical measurements within normal limits will improve Outcome: Adequate for Discharge Goal: Will remain free from infection Outcome: Adequate for Discharge Goal: Diagnostic test results will improve Outcome: Adequate for Discharge Goal: Respiratory complications will improve Outcome: Adequate for Discharge Goal:  Cardiovascular complication will be avoided Outcome: Adequate for Discharge   Problem: Activity: Goal: Risk for activity intolerance will decrease Outcome: Adequate for Discharge   Problem: Nutrition: Goal: Adequate nutrition will be maintained Outcome: Adequate for Discharge   Problem: Coping: Goal: Level of anxiety will decrease Outcome: Adequate for Discharge   Problem: Elimination: Goal: Will not experience complications related to bowel motility Outcome: Adequate for Discharge Goal: Will not experience complications related to urinary retention Outcome: Adequate for Discharge   Problem: Pain Managment: Goal: General experience of comfort will improve Outcome: Adequate for Discharge   Problem: Safety: Goal: Ability to remain free from injury will improve Outcome: Adequate for Discharge   Problem: Skin Integrity: Goal: Risk for impaired skin integrity will decrease Outcome: Adequate for Discharge   Problem: Education: Goal: Knowledge of condition will improve Outcome: Adequate for Discharge Goal: Individualized Educational Video(s) Outcome: Adequate for Discharge Goal: Individualized Newborn Educational Video(s) Outcome: Adequate for Discharge   Problem: Activity: Goal: Will verbalize the importance of balancing activity with adequate rest periods Outcome: Adequate for Discharge Goal: Ability to tolerate increased activity will improve Outcome: Adequate for Discharge   Problem: Coping: Goal: Ability to identify and utilize available resources and services will improve Outcome: Adequate for Discharge   Problem: Life Cycle: Goal: Chance of risk for complications during the postpartum period will decrease Outcome: Adequate for Discharge   Problem: Role Relationship: Goal: Ability to demonstrate positive interaction with newborn will improve Outcome: Adequate for Discharge   Problem: Skin Integrity: Goal: Demonstration of wound healing without infection will  improve Outcome: Adequate for Discharge   

## 2023-02-28 NOTE — Progress Notes (Signed)
   02/28/23 1018  Departure Condition  Departure Condition Good  Mobility at [facility]  Patient/Caregiver Teaching Teach Back Method Used;Discharge instructions reviewed;Prescriptions reviewed;Follow-up care reviewed;Pain management discussed;Medications discussed;Patient/caregiver verbalized understanding;Educated about hypertension in pregnancy  Departure Mode With significant other  Was procedural sedation performed on this patient during this visit? No   Patient alert and oriented x4, VS and pain stable at time of discharge with RN.

## 2023-03-11 ENCOUNTER — Encounter (HOSPITAL_COMMUNITY): Payer: Self-pay | Admitting: Obstetrics & Gynecology

## 2023-03-29 ENCOUNTER — Telehealth (HOSPITAL_COMMUNITY): Payer: Self-pay

## 2023-03-29 NOTE — Telephone Encounter (Signed)
03/29/2023 1822  Name: LEIANA RUND MRN: [id number] DOB: 1995/07/30  Reason for Call:  Transition of Care Hospital Discharge Call  Contact Status: Patient Contact Status: Message  Language assistant needed: Interpreter Mode: Interpreter Not Needed        Follow-Up Questions:    Inocente Salles Postnatal Depression Scale:  In the Past 7 Days:    PHQ2-9 Depression Scale:     Discharge Follow-up:    Post-discharge interventions: NA  Signature  Signe Colt

## 2023-04-25 DIAGNOSIS — Z419 Encounter for procedure for purposes other than remedying health state, unspecified: Secondary | ICD-10-CM | POA: Diagnosis not present

## 2023-04-29 DIAGNOSIS — K5900 Constipation, unspecified: Secondary | ICD-10-CM | POA: Diagnosis not present

## 2023-05-25 DIAGNOSIS — Z419 Encounter for procedure for purposes other than remedying health state, unspecified: Secondary | ICD-10-CM | POA: Diagnosis not present

## 2023-06-25 DIAGNOSIS — Z419 Encounter for procedure for purposes other than remedying health state, unspecified: Secondary | ICD-10-CM | POA: Diagnosis not present

## 2023-07-26 DIAGNOSIS — Z419 Encounter for procedure for purposes other than remedying health state, unspecified: Secondary | ICD-10-CM | POA: Diagnosis not present

## 2023-08-23 DIAGNOSIS — Z419 Encounter for procedure for purposes other than remedying health state, unspecified: Secondary | ICD-10-CM | POA: Diagnosis not present

## 2023-09-30 DIAGNOSIS — J342 Deviated nasal septum: Secondary | ICD-10-CM | POA: Diagnosis not present

## 2023-09-30 DIAGNOSIS — J309 Allergic rhinitis, unspecified: Secondary | ICD-10-CM | POA: Diagnosis not present

## 2023-10-04 DIAGNOSIS — Z419 Encounter for procedure for purposes other than remedying health state, unspecified: Secondary | ICD-10-CM | POA: Diagnosis not present

## 2023-11-03 DIAGNOSIS — Z419 Encounter for procedure for purposes other than remedying health state, unspecified: Secondary | ICD-10-CM | POA: Diagnosis not present

## 2023-12-04 DIAGNOSIS — Z419 Encounter for procedure for purposes other than remedying health state, unspecified: Secondary | ICD-10-CM | POA: Diagnosis not present

## 2024-01-03 DIAGNOSIS — Z419 Encounter for procedure for purposes other than remedying health state, unspecified: Secondary | ICD-10-CM | POA: Diagnosis not present

## 2024-02-03 DIAGNOSIS — Z419 Encounter for procedure for purposes other than remedying health state, unspecified: Secondary | ICD-10-CM | POA: Diagnosis not present

## 2024-03-05 DIAGNOSIS — Z419 Encounter for procedure for purposes other than remedying health state, unspecified: Secondary | ICD-10-CM | POA: Diagnosis not present
